# Patient Record
Sex: Male | Born: 1950 | Race: White | Hispanic: No | Marital: Married | Smoking: Never smoker
Health system: Southern US, Community
[De-identification: ages and names within clinical notes are randomized; demographics above are authoritative.]

## PROBLEM LIST (undated history)

## (undated) DIAGNOSIS — K4090 Unilateral inguinal hernia, without obstruction or gangrene, not specified as recurrent: Secondary | ICD-10-CM

## (undated) DIAGNOSIS — E785 Hyperlipidemia, unspecified: Secondary | ICD-10-CM

## (undated) DIAGNOSIS — R972 Elevated prostate specific antigen [PSA]: Secondary | ICD-10-CM

## (undated) DIAGNOSIS — I10 Essential (primary) hypertension: Secondary | ICD-10-CM

## (undated) DIAGNOSIS — Z789 Other specified health status: Secondary | ICD-10-CM

## (undated) HISTORY — DX: Essential (primary) hypertension: I10

## (undated) HISTORY — DX: Unilateral inguinal hernia, without obstruction or gangrene, not specified as recurrent: K40.90

## (undated) HISTORY — DX: Hyperlipidemia, unspecified: E78.5

## (undated) HISTORY — PX: TONSILLECTOMY: SUR1361

## (undated) HISTORY — DX: Elevated prostate specific antigen (PSA): R97.20

---

## 2001-02-27 HISTORY — PX: HAND RECONSTRUCTION: SHX1730

## 2003-06-19 ENCOUNTER — Ambulatory Visit (HOSPITAL_COMMUNITY): Admission: RE | Admit: 2003-06-19 | Discharge: 2003-06-20 | Payer: Self-pay | Admitting: Orthopedic Surgery

## 2012-01-17 ENCOUNTER — Encounter: Payer: Self-pay | Admitting: Internal Medicine

## 2012-02-07 ENCOUNTER — Ambulatory Visit (AMBULATORY_SURGERY_CENTER): Payer: BC Managed Care – PPO | Admitting: *Deleted

## 2012-02-07 ENCOUNTER — Encounter: Payer: Self-pay | Admitting: Internal Medicine

## 2012-02-07 VITALS — Ht 68.0 in | Wt 299.0 lb

## 2012-02-07 DIAGNOSIS — Z1211 Encounter for screening for malignant neoplasm of colon: Secondary | ICD-10-CM

## 2012-02-07 MED ORDER — MOVIPREP 100 G PO SOLR: ORAL | Status: DC

## 2012-02-20 ENCOUNTER — Encounter: Payer: Self-pay | Admitting: Internal Medicine

## 2015-10-12 DIAGNOSIS — M25512 Pain in left shoulder: Secondary | ICD-10-CM | POA: Diagnosis not present

## 2015-10-21 DIAGNOSIS — H52223 Regular astigmatism, bilateral: Secondary | ICD-10-CM | POA: Diagnosis not present

## 2015-10-21 DIAGNOSIS — H524 Presbyopia: Secondary | ICD-10-CM | POA: Diagnosis not present

## 2015-10-21 DIAGNOSIS — H5203 Hypermetropia, bilateral: Secondary | ICD-10-CM | POA: Diagnosis not present

## 2015-10-29 DIAGNOSIS — H33322 Round hole, left eye: Secondary | ICD-10-CM | POA: Diagnosis not present

## 2015-10-29 DIAGNOSIS — H43813 Vitreous degeneration, bilateral: Secondary | ICD-10-CM | POA: Diagnosis not present

## 2015-10-29 DIAGNOSIS — H35412 Lattice degeneration of retina, left eye: Secondary | ICD-10-CM | POA: Diagnosis not present

## 2015-10-29 DIAGNOSIS — H2513 Age-related nuclear cataract, bilateral: Secondary | ICD-10-CM | POA: Diagnosis not present

## 2015-11-28 HISTORY — PX: EYE SURGERY: SHX253

## 2015-12-02 DIAGNOSIS — H25813 Combined forms of age-related cataract, bilateral: Secondary | ICD-10-CM | POA: Diagnosis not present

## 2015-12-02 DIAGNOSIS — H524 Presbyopia: Secondary | ICD-10-CM | POA: Diagnosis not present

## 2015-12-02 DIAGNOSIS — H35412 Lattice degeneration of retina, left eye: Secondary | ICD-10-CM | POA: Diagnosis not present

## 2015-12-27 DIAGNOSIS — H2512 Age-related nuclear cataract, left eye: Secondary | ICD-10-CM | POA: Diagnosis not present

## 2016-01-03 DIAGNOSIS — H2512 Age-related nuclear cataract, left eye: Secondary | ICD-10-CM | POA: Diagnosis not present

## 2016-01-03 DIAGNOSIS — H25812 Combined forms of age-related cataract, left eye: Secondary | ICD-10-CM | POA: Diagnosis not present

## 2016-02-17 DIAGNOSIS — H35412 Lattice degeneration of retina, left eye: Secondary | ICD-10-CM | POA: Diagnosis not present

## 2016-02-17 DIAGNOSIS — H43813 Vitreous degeneration, bilateral: Secondary | ICD-10-CM | POA: Diagnosis not present

## 2016-03-02 DIAGNOSIS — H43813 Vitreous degeneration, bilateral: Secondary | ICD-10-CM | POA: Diagnosis not present

## 2016-03-02 DIAGNOSIS — H35412 Lattice degeneration of retina, left eye: Secondary | ICD-10-CM | POA: Diagnosis not present

## 2016-03-03 ENCOUNTER — Ambulatory Visit
Admission: EM | Admit: 2016-03-03 | Discharge: 2016-03-03 | Disposition: A | Discharge status: Home / Self Care | Payer: PPO | Attending: Family Medicine | Admitting: Family Medicine

## 2016-03-03 DIAGNOSIS — J101 Influenza due to other identified influenza virus with other respiratory manifestations: Secondary | ICD-10-CM

## 2016-03-03 LAB — RAPID INFLUENZA A&B ANTIGENS (ARMC ONLY)
INFLUENZA A (ARMC): POSITIVE — AB
INFLUENZA B (ARMC): NEGATIVE

## 2016-03-03 MED ORDER — OSELTAMIVIR PHOSPHATE 75 MG PO CAPS: 75.0000 mg | ORAL_CAPSULE | Freq: Two times a day (BID) | ORAL | 0 refills | Status: DC

## 2016-03-03 NOTE — ED Triage Notes (Signed)
Patient complains of fever, bodyaches, congestion. Patient states that he has been chills, sweats.  Patient states that symptoms started late Wednesday evening and have been worsening.

## 2016-03-03 NOTE — ED Provider Notes (Signed)
[facility]    CSN: [id number] Arrival date & time: 03/03/16  [id number]     History   Chief Complaint Chief Complaint  Patient presents with  . Cough    HPI Eric HerterDavid P Oliver is a 66 y.o. male.   The history is provided by the patient.  URI  Presenting symptoms: congestion, cough and fever   Severity:  Moderate Onset quality:  Sudden Duration:  2 days Timing:  Constant Progression:  Worsening Chronicity:  New Relieved by:  Nothing Worsened by:  Nothing Associated symptoms: headaches and myalgias   Associated symptoms: no wheezing   Risk factors: being elderly and sick contacts   Risk factors: no chronic cardiac disease, no chronic kidney disease, no chronic respiratory disease, no diabetes mellitus, no immunosuppression, no recent illness and no recent travel     History reviewed. No pertinent past medical history.  There are no active problems to display for this patient.   Past Surgical History:  Procedure Laterality Date  . HAND RECONSTRUCTION  2003   right       Home Medications    Prior to Admission medications   Medication Sig Start Date End Date Taking? Authorizing Provider  aspirin 81 MG tablet Take 81 mg by mouth daily.    Historical Provider, MD  MOVIPREP 100 G SOLR moviprep as directed. No substitutions 02/07/12   Hilarie FredricksonJohn N Perry, MD  oseltamivir (TAMIFLU) 75 MG capsule Take 1 capsule (75 mg total) by mouth 2 (two) times daily. 03/03/16   Payton Mccallumrlando Augustin Bun, MD    Family History Family History  Problem Relation Age of Onset  . Colon cancer Neg Hx   . Stomach cancer Neg Hx     Social History Social History  Substance Use Topics  . Smoking status: Never Smoker  . Smokeless tobacco: Never Used  . Alcohol use 3.6 oz/week    6 Cans of beer per week     Allergies   Patient has no known allergies.   Review of Systems Review of Systems  Constitutional: Positive for fever.  HENT: Positive for congestion.   Respiratory: Positive for  cough. Negative for wheezing.   Musculoskeletal: Positive for myalgias.  Neurological: Positive for headaches.     Physical Exam Triage Vital Signs ED Triage Vitals  Enc Vitals Group     BP 03/03/16 1018 136/79     Pulse Rate 03/03/16 1018 96     Resp 03/03/16 1018 17     Temp 03/03/16 1018 99.5 F (37.5 C)     Temp Source 03/03/16 1018 Oral     SpO2 03/03/16 1018 98 %     Weight 03/03/16 1015 185 lb (83.9 kg)     Height 03/03/16 1015 5\' 8"  (1.727 m)     Head Circumference --      Peak Flow --      Pain Score 03/03/16 1017 7     Pain Loc --      Pain Edu? --      Excl. in GC? --    No data found.   Updated Vital Signs BP 136/79 (BP Location: Left Arm)   Pulse 96   Temp 99.5 F (37.5 C) (Oral)   Resp 17   Ht 5\' 8"  (1.727 m)   Wt 185 lb (83.9 kg)   SpO2 98%   BMI 28.13 kg/m   Visual Acuity Right Eye Distance:   Left Eye Distance:   Bilateral Distance:    Right Eye Near:  Left Eye Near:    Bilateral Near:     Physical Exam  Constitutional: He appears well-developed and well-nourished. No distress.  HENT:  Head: Normocephalic and atraumatic.  Right Ear: Tympanic membrane, external ear and ear canal normal.  Left Ear: Tympanic membrane, external ear and ear canal normal.  Nose: Nose normal.  Mouth/Throat: Uvula is midline, oropharynx is clear and moist and mucous membranes are normal. No oropharyngeal exudate or tonsillar abscesses.  Eyes: Conjunctivae and EOM are normal. Pupils are equal, round, and reactive to light. Right eye exhibits no discharge. Left eye exhibits no discharge. No scleral icterus.  Neck: Normal range of motion. Neck supple. No tracheal deviation present. No thyromegaly present.  Cardiovascular: Normal rate, regular rhythm and normal heart sounds.   Pulmonary/Chest: Effort normal and breath sounds normal. No stridor. No respiratory distress. He has no wheezes. He has no rales. He exhibits no tenderness.  Lymphadenopathy:    He has no  cervical adenopathy.  Neurological: He is alert.  Skin: Skin is warm and dry. No rash noted. He is not diaphoretic.  Nursing note and vitals reviewed.    UC Treatments / Results  Labs (all labs ordered are listed, but only abnormal results are displayed) Labs Reviewed  RAPID INFLUENZA A&B ANTIGENS ([facility] ONLY) - Abnormal; Notable for the following:       Result Value   Influenza A ([facility]) POSITIVE (*)    All other components within normal limits    EKG  EKG Interpretation None       Radiology No results found.  Procedures Procedures (including critical care time)  Medications Ordered in UC Medications - No data to display   Initial Impression / Assessment and Plan / UC Course  I have reviewed the triage vital signs and the nursing notes.  Pertinent labs & imaging results that were available during my care of the patient were reviewed by me and considered in my medical decision making (see chart for details).  Clinical Course       Final Clinical Impressions(s) / UC Diagnoses   Final diagnoses:  Influenza A    New Prescriptions Discharge Medication List as of 03/03/2016 10:54 AM    START taking these medications   Details  oseltamivir (TAMIFLU) 75 MG capsule Take 1 capsule (75 mg total) by mouth 2 (two) times daily., Starting Fri 03/03/2016, Normal       1. Lab results and diagnosis reviewed with patient 2. rx as per orders above; reviewed possible side effects, interactions, risks and benefits  3. Recommend supportive treatment with rest, fluids   4. Follow-up prn if symptoms worsen or don't improve   Payton Mccallum, MD 03/03/16 [phone]

## 2016-03-09 DIAGNOSIS — H35412 Lattice degeneration of retina, left eye: Secondary | ICD-10-CM | POA: Diagnosis not present

## 2016-03-09 DIAGNOSIS — H04123 Dry eye syndrome of bilateral lacrimal glands: Secondary | ICD-10-CM | POA: Diagnosis not present

## 2016-03-09 DIAGNOSIS — H26492 Other secondary cataract, left eye: Secondary | ICD-10-CM | POA: Diagnosis not present

## 2016-03-09 DIAGNOSIS — H2511 Age-related nuclear cataract, right eye: Secondary | ICD-10-CM | POA: Diagnosis not present

## 2016-03-09 DIAGNOSIS — H33312 Horseshoe tear of retina without detachment, left eye: Secondary | ICD-10-CM | POA: Diagnosis not present

## 2016-07-10 DIAGNOSIS — M5431 Sciatica, right side: Secondary | ICD-10-CM | POA: Diagnosis not present

## 2016-07-10 DIAGNOSIS — S335XXA Sprain of ligaments of lumbar spine, initial encounter: Secondary | ICD-10-CM | POA: Diagnosis not present

## 2016-08-01 DIAGNOSIS — H5203 Hypermetropia, bilateral: Secondary | ICD-10-CM | POA: Diagnosis not present

## 2016-08-01 DIAGNOSIS — H04123 Dry eye syndrome of bilateral lacrimal glands: Secondary | ICD-10-CM | POA: Diagnosis not present

## 2016-08-01 DIAGNOSIS — H5310 Unspecified subjective visual disturbances: Secondary | ICD-10-CM | POA: Diagnosis not present

## 2016-08-01 DIAGNOSIS — H26492 Other secondary cataract, left eye: Secondary | ICD-10-CM | POA: Diagnosis not present

## 2016-08-29 DIAGNOSIS — H5310 Unspecified subjective visual disturbances: Secondary | ICD-10-CM | POA: Diagnosis not present

## 2016-08-29 DIAGNOSIS — H04123 Dry eye syndrome of bilateral lacrimal glands: Secondary | ICD-10-CM | POA: Diagnosis not present

## 2016-09-20 DIAGNOSIS — M545 Low back pain: Secondary | ICD-10-CM | POA: Diagnosis not present

## 2016-09-21 ENCOUNTER — Other Ambulatory Visit: Payer: Self-pay | Admitting: Sports Medicine

## 2016-09-21 DIAGNOSIS — G8929 Other chronic pain: Secondary | ICD-10-CM

## 2016-09-21 DIAGNOSIS — M545 Low back pain: Principal | ICD-10-CM

## 2016-10-01 ENCOUNTER — Ambulatory Visit
Admission: RE | Admit: 2016-10-01 | Discharge: 2016-10-01 | Disposition: A | Discharge status: Home / Self Care | Payer: PPO | Source: Ambulatory Visit | Attending: Sports Medicine | Admitting: Sports Medicine

## 2016-10-01 DIAGNOSIS — M5126 Other intervertebral disc displacement, lumbar region: Secondary | ICD-10-CM | POA: Diagnosis not present

## 2016-10-01 DIAGNOSIS — M545 Low back pain: Principal | ICD-10-CM

## 2016-10-01 DIAGNOSIS — G8929 Other chronic pain: Secondary | ICD-10-CM

## 2016-10-03 DIAGNOSIS — M5106 Intervertebral disc disorders with myelopathy, lumbar region: Secondary | ICD-10-CM | POA: Diagnosis not present

## 2016-10-05 ENCOUNTER — Other Ambulatory Visit: Payer: Self-pay | Admitting: Neurological Surgery

## 2016-10-05 DIAGNOSIS — Z6827 Body mass index (BMI) 27.0-27.9, adult: Secondary | ICD-10-CM | POA: Diagnosis not present

## 2016-10-05 DIAGNOSIS — R030 Elevated blood-pressure reading, without diagnosis of hypertension: Secondary | ICD-10-CM | POA: Diagnosis not present

## 2016-10-05 DIAGNOSIS — M5126 Other intervertebral disc displacement, lumbar region: Secondary | ICD-10-CM | POA: Diagnosis not present

## 2016-10-10 DIAGNOSIS — M5417 Radiculopathy, lumbosacral region: Secondary | ICD-10-CM | POA: Diagnosis not present

## 2016-10-20 ENCOUNTER — Encounter (HOSPITAL_COMMUNITY)
Admission: RE | Admit: 2016-10-20 | Discharge: 2016-10-20 | Disposition: A | Discharge status: Home / Self Care | Payer: PPO | Source: Ambulatory Visit | Attending: Neurological Surgery | Admitting: Neurological Surgery

## 2016-10-20 ENCOUNTER — Encounter (HOSPITAL_COMMUNITY)
Admission: RE | Admit: 2016-10-20 | Discharge: 2016-10-20 | Disposition: A | Discharge status: Home / Self Care | Payer: PPO | Source: Ambulatory Visit | Attending: Neurology | Admitting: Neurology

## 2016-10-20 ENCOUNTER — Encounter (HOSPITAL_COMMUNITY): Payer: Self-pay

## 2016-10-20 DIAGNOSIS — Z01818 Encounter for other preprocedural examination: Secondary | ICD-10-CM | POA: Insufficient documentation

## 2016-10-20 DIAGNOSIS — M5126 Other intervertebral disc displacement, lumbar region: Secondary | ICD-10-CM | POA: Diagnosis not present

## 2016-10-20 DIAGNOSIS — R918 Other nonspecific abnormal finding of lung field: Secondary | ICD-10-CM | POA: Diagnosis not present

## 2016-10-20 HISTORY — DX: Other specified health status: Z78.9

## 2016-10-20 LAB — BASIC METABOLIC PANEL
ANION GAP: 10 (ref 5–15)
BUN: 12 mg/dL (ref 6–20)
CALCIUM: 9.4 mg/dL (ref 8.9–10.3)
CO2: 25 mmol/L (ref 22–32)
CREATININE: 1.01 mg/dL (ref 0.61–1.24)
Chloride: 106 mmol/L (ref 101–111)
Glucose, Bld: 140 mg/dL — ABNORMAL HIGH (ref 65–99)
Potassium: 3.6 mmol/L (ref 3.5–5.1)
Sodium: 141 mmol/L (ref 135–145)

## 2016-10-20 LAB — CBC WITH DIFFERENTIAL/PLATELET
BASOS ABS: 0 10*3/uL (ref 0.0–0.1)
BASOS PCT: 0 %
EOS ABS: 0.1 10*3/uL (ref 0.0–0.7)
Eosinophils Relative: 2 %
HEMATOCRIT: 44.1 % (ref 39.0–52.0)
HEMOGLOBIN: 15.2 g/dL (ref 13.0–17.0)
Lymphocytes Relative: 25 %
Lymphs Abs: 1.3 10*3/uL (ref 0.7–4.0)
MCH: 30 pg (ref 26.0–34.0)
MCHC: 34.5 g/dL (ref 30.0–36.0)
MCV: 87.2 fL (ref 78.0–100.0)
MONO ABS: 0.4 10*3/uL (ref 0.1–1.0)
MONOS PCT: 9 %
NEUTROS ABS: 3.4 10*3/uL (ref 1.7–7.7)
NEUTROS PCT: 64 %
Platelets: 273 10*3/uL (ref 150–400)
RBC: 5.06 MIL/uL (ref 4.22–5.81)
RDW: 12.5 % (ref 11.5–15.5)
WBC: 5.2 10*3/uL (ref 4.0–10.5)

## 2016-10-20 LAB — SURGICAL PCR SCREEN
MRSA, PCR: NEGATIVE
STAPHYLOCOCCUS AUREUS: NEGATIVE

## 2016-10-20 LAB — PROTIME-INR
INR: 0.99
PROTHROMBIN TIME: 13.1 s (ref 11.4–15.2)

## 2016-10-20 NOTE — Pre-Procedure Instructions (Addendum)
[place] [facility]  10/20/2016      [id number] - [place], [place] - [address] Phone: [phone] Fax: [phone]    Your procedure is scheduled on 10/26/16  Report to [facility] Admitting  at 1:00 P.M.  Call this number if you have problems the morning of surgery:  [phone]   Remember:  Do not eat food or drink liquids after midnight.  Take these medicines the morning of surgery with A SIP OF WATER    Gabapentin if needed,hydrocodone if needed  STOP all herbel meds, nsaids (aleve,naproxen,advil,ibuprofen) prior to surgery starting today 10/20/16 including all vitamins/supplements,aspirin,fish oil, goodys ,.BC powders   Do not wear jewelry  Do not wear lotions, powders, or perfumes, or deoderant.  Do not shave 48 hours prior to surgery.  Men may shave face and neck.  Do not bring valuables to the hospital.  [facility] is not responsible for any belongings or valuables.  Contacts, dentures or bridgework may not be worn into surgery.  Leave your suitcase in the car.  After surgery it may be brought to your room.  For patients admitted to the hospital, discharge time will be determined by your treatment team.  Patients discharged the day of surgery will not be allowed to drive home.   Special instructions:   Special Instructions: [place] - Preparing for Surgery  Before surgery, you can play an important role.  Because skin is not sterile, your skin needs to be as free of germs as possible.  You can reduce the number of germs on you skin by washing with CHG (chlorahexidine gluconate) soap before surgery.  CHG is an antiseptic cleaner which kills germs and bonds with the skin to continue killing germs even after washing.  Please DO NOT use if you have an allergy to CHG or antibacterial soaps.  If your skin becomes reddened/irritated stop using the CHG and inform your nurse when you arrive at [facility].  Do not shave (including legs and underarms) for at least 48 hours prior to the first CHG shower.  You may shave your face.  Please follow these instructions carefully:   1.  Shower with CHG Soap the night before surgery and the morning of Surgery.  2.  If you choose to wash your hair, wash your hair first as usual with your normal shampoo.  3.  After you shampoo, rinse your hair and body thoroughly to remove the Shampoo.  4.  Use CHG as you would any other liquid soap.  You can apply chg directly  to the skin and wash gently with scrungie or a clean washcloth.  5.  Apply the CHG Soap to your body ONLY FROM THE NECK DOWN.  Do not use on open wounds or open sores.  Avoid contact with your eyes ears, mouth and genitals (private parts).  Wash genitals (private parts)       with your normal soap.  6.  Wash thoroughly, paying special attention to the area where your surgery will be performed.  7.  Thoroughly rinse your body with warm water from the neck down.  8.  DO NOT shower/wash with your normal soap after using and rinsing off the CHG Soap.  9.  Pat yourself dry with a clean towel.            10.  Wear clean pajamas.            11.  Place clean sheets on your bed the night of your first shower and do not sleep with pets.  Day of Surgery  Do not apply any lotions/deodorants the morning of surgery.  Please wear clean clothes to the hospital/surgery center.  Please read over the fact sheets that you were given.

## 2016-10-26 ENCOUNTER — Ambulatory Visit (HOSPITAL_COMMUNITY)
Admission: RE | Disposition: A | Discharge status: Home / Self Care | Payer: Self-pay | Source: Ambulatory Visit | Attending: Neurological Surgery

## 2016-10-26 ENCOUNTER — Ambulatory Visit (HOSPITAL_COMMUNITY): Payer: PPO | Admitting: Certified Registered Nurse Anesthetist

## 2016-10-26 ENCOUNTER — Encounter (HOSPITAL_COMMUNITY): Payer: Self-pay | Admitting: *Deleted

## 2016-10-26 ENCOUNTER — Observation Stay (HOSPITAL_COMMUNITY)
Admission: RE | Admit: 2016-10-26 | Discharge: 2016-10-26 | Disposition: A | Discharge status: Home / Self Care | Payer: PPO | Source: Ambulatory Visit | Attending: Neurological Surgery | Admitting: Neurological Surgery

## 2016-10-26 ENCOUNTER — Ambulatory Visit (HOSPITAL_COMMUNITY): Payer: PPO

## 2016-10-26 DIAGNOSIS — Z4789 Encounter for other orthopedic aftercare: Secondary | ICD-10-CM | POA: Diagnosis not present

## 2016-10-26 DIAGNOSIS — Z9102 Food additives allergy status: Secondary | ICD-10-CM | POA: Insufficient documentation

## 2016-10-26 DIAGNOSIS — Z419 Encounter for procedure for purposes other than remedying health state, unspecified: Secondary | ICD-10-CM

## 2016-10-26 DIAGNOSIS — M5126 Other intervertebral disc displacement, lumbar region: Secondary | ICD-10-CM | POA: Diagnosis not present

## 2016-10-26 DIAGNOSIS — G8929 Other chronic pain: Secondary | ICD-10-CM | POA: Insufficient documentation

## 2016-10-26 DIAGNOSIS — M48061 Spinal stenosis, lumbar region without neurogenic claudication: Secondary | ICD-10-CM | POA: Insufficient documentation

## 2016-10-26 DIAGNOSIS — Z79899 Other long term (current) drug therapy: Secondary | ICD-10-CM | POA: Diagnosis not present

## 2016-10-26 DIAGNOSIS — M5116 Intervertebral disc disorders with radiculopathy, lumbar region: Secondary | ICD-10-CM | POA: Diagnosis not present

## 2016-10-26 DIAGNOSIS — Z9889 Other specified postprocedural states: Secondary | ICD-10-CM

## 2016-10-26 DIAGNOSIS — Z7982 Long term (current) use of aspirin: Secondary | ICD-10-CM | POA: Diagnosis not present

## 2016-10-26 HISTORY — PX: LUMBAR LAMINECTOMY/DECOMPRESSION MICRODISCECTOMY: SHX5026

## 2016-10-26 SURGERY — LUMBAR LAMINECTOMY/DECOMPRESSION MICRODISCECTOMY 2 LEVELS
Anesthesia: General | Site: Back | Laterality: Right

## 2016-10-26 MED ORDER — HYDROCODONE-ACETAMINOPHEN 7.5-325 MG PO TABS
ORAL_TABLET | ORAL | Status: AC
  Filled 2016-10-26: qty 1

## 2016-10-26 MED ORDER — ONDANSETRON HCL 4 MG/2ML IJ SOLN
INTRAMUSCULAR | Status: AC
  Filled 2016-10-26: qty 2

## 2016-10-26 MED ORDER — 0.9 % SODIUM CHLORIDE (POUR BTL) OPTIME
TOPICAL | Status: DC | PRN
  Administered 2016-10-26: 1000 mL

## 2016-10-26 MED ORDER — METHOCARBAMOL 500 MG PO TABS
500.0000 mg | ORAL_TABLET | Freq: Four times a day (QID) | ORAL | Status: DC | PRN
  Administered 2016-10-26: 500 mg via ORAL

## 2016-10-26 MED ORDER — POTASSIUM CHLORIDE IN NACL 20-0.9 MEQ/L-% IV SOLN: INTRAVENOUS | Status: DC

## 2016-10-26 MED ORDER — PROPOFOL 10 MG/ML IV BOLUS
INTRAVENOUS | Status: AC
  Filled 2016-10-26: qty 20

## 2016-10-26 MED ORDER — LACTATED RINGERS IV SOLN
INTRAVENOUS | Status: DC
  Administered 2016-10-26 (×2): via INTRAVENOUS

## 2016-10-26 MED ORDER — MIDAZOLAM HCL 5 MG/5ML IJ SOLN
INTRAMUSCULAR | Status: DC | PRN
  Administered 2016-10-26: 2 mg via INTRAVENOUS

## 2016-10-26 MED ORDER — BUPIVACAINE HCL (PF) 0.5 % IJ SOLN
INTRAMUSCULAR | Status: DC | PRN
  Administered 2016-10-26: 4 mL

## 2016-10-26 MED ORDER — ROCURONIUM BROMIDE 100 MG/10ML IV SOLN
INTRAVENOUS | Status: DC | PRN
  Administered 2016-10-26: 10 mg via INTRAVENOUS
  Administered 2016-10-26: 50 mg via INTRAVENOUS

## 2016-10-26 MED ORDER — HEMOSTATIC AGENTS (NO CHARGE) OPTIME
TOPICAL | Status: DC | PRN
  Administered 2016-10-26: 1 via TOPICAL

## 2016-10-26 MED ORDER — METHOCARBAMOL 500 MG PO TABS
ORAL_TABLET | ORAL | Status: AC
  Filled 2016-10-26: qty 1

## 2016-10-26 MED ORDER — HYDROMORPHONE HCL 1 MG/ML IJ SOLN
INTRAMUSCULAR | Status: AC
  Filled 2016-10-26: qty 1

## 2016-10-26 MED ORDER — CHLORHEXIDINE GLUCONATE CLOTH 2 % EX PADS: 6.0000 | MEDICATED_PAD | Freq: Once | CUTANEOUS | Status: DC

## 2016-10-26 MED ORDER — EPHEDRINE SULFATE 50 MG/ML IJ SOLN
INTRAMUSCULAR | Status: DC | PRN
  Administered 2016-10-26 (×3): 5 mg via INTRAVENOUS

## 2016-10-26 MED ORDER — PHENYLEPHRINE 40 MCG/ML (10ML) SYRINGE FOR IV PUSH (FOR BLOOD PRESSURE SUPPORT)
PREFILLED_SYRINGE | INTRAVENOUS | Status: AC
  Filled 2016-10-26: qty 10

## 2016-10-26 MED ORDER — SODIUM CHLORIDE 0.9 % IR SOLN
Status: DC | PRN
  Administered 2016-10-26: 15:00:00

## 2016-10-26 MED ORDER — DEXAMETHASONE SODIUM PHOSPHATE 10 MG/ML IJ SOLN
10.0000 mg | INTRAMUSCULAR | Status: DC
  Filled 2016-10-26: qty 1

## 2016-10-26 MED ORDER — EPHEDRINE 5 MG/ML INJ
INTRAVENOUS | Status: AC
  Filled 2016-10-26: qty 10

## 2016-10-26 MED ORDER — BUPIVACAINE HCL (PF) 0.5 % IJ SOLN
INTRAMUSCULAR | Status: AC
  Filled 2016-10-26: qty 30

## 2016-10-26 MED ORDER — DEXAMETHASONE SODIUM PHOSPHATE 10 MG/ML IJ SOLN
INTRAMUSCULAR | Status: DC | PRN
  Administered 2016-10-26: 10 mg via INTRAVENOUS

## 2016-10-26 MED ORDER — MIDAZOLAM HCL 2 MG/2ML IJ SOLN
INTRAMUSCULAR | Status: AC
  Filled 2016-10-26: qty 2

## 2016-10-26 MED ORDER — THROMBIN 5000 UNITS EX SOLR
CUTANEOUS | Status: AC
  Filled 2016-10-26: qty 15000

## 2016-10-26 MED ORDER — PROMETHAZINE HCL 25 MG/ML IJ SOLN: 6.2500 mg | INTRAMUSCULAR | Status: DC | PRN

## 2016-10-26 MED ORDER — SUGAMMADEX SODIUM 200 MG/2ML IV SOLN
INTRAVENOUS | Status: DC | PRN
  Administered 2016-10-26: 200 mg via INTRAVENOUS

## 2016-10-26 MED ORDER — ACETAMINOPHEN 10 MG/ML IV SOLN
INTRAVENOUS | Status: AC
  Filled 2016-10-26: qty 100

## 2016-10-26 MED ORDER — FENTANYL CITRATE (PF) 100 MCG/2ML IJ SOLN
INTRAMUSCULAR | Status: DC | PRN
  Administered 2016-10-26 (×5): 50 ug via INTRAVENOUS

## 2016-10-26 MED ORDER — THROMBIN 5000 UNITS EX SOLR
CUTANEOUS | Status: DC | PRN
  Administered 2016-10-26: 10000 [IU] via TOPICAL

## 2016-10-26 MED ORDER — PHENYLEPHRINE HCL 10 MG/ML IJ SOLN
INTRAMUSCULAR | Status: DC | PRN
  Administered 2016-10-26: 40 ug via INTRAVENOUS

## 2016-10-26 MED ORDER — ACETAMINOPHEN 10 MG/ML IV SOLN
INTRAVENOUS | Status: DC | PRN
  Administered 2016-10-26: 1000 mg via INTRAVENOUS

## 2016-10-26 MED ORDER — HYDROCODONE-ACETAMINOPHEN 7.5-325 MG PO TABS
1.0000 | ORAL_TABLET | Freq: Four times a day (QID) | ORAL | Status: DC
  Administered 2016-10-26: 1 via ORAL

## 2016-10-26 MED ORDER — FENTANYL CITRATE (PF) 250 MCG/5ML IJ SOLN
INTRAMUSCULAR | Status: AC
  Filled 2016-10-26: qty 5

## 2016-10-26 MED ORDER — DEXAMETHASONE SODIUM PHOSPHATE 10 MG/ML IJ SOLN
INTRAMUSCULAR | Status: AC
  Filled 2016-10-26: qty 1

## 2016-10-26 MED ORDER — HYDROMORPHONE HCL 1 MG/ML IJ SOLN
0.2500 mg | INTRAMUSCULAR | Status: DC | PRN
  Administered 2016-10-26 (×4): 0.5 mg via INTRAVENOUS

## 2016-10-26 MED ORDER — LIDOCAINE HCL (CARDIAC) 20 MG/ML IV SOLN
INTRAVENOUS | Status: DC | PRN
  Administered 2016-10-26: 60 mg via INTRAVENOUS

## 2016-10-26 MED ORDER — ONDANSETRON HCL 4 MG/2ML IJ SOLN
INTRAMUSCULAR | Status: DC | PRN
  Administered 2016-10-26: 4 mg via INTRAVENOUS

## 2016-10-26 MED ORDER — PHENYLEPHRINE HCL 10 MG/ML IJ SOLN
INTRAVENOUS | Status: DC | PRN
  Administered 2016-10-26: 20 ug/min via INTRAVENOUS

## 2016-10-26 MED ORDER — PROPOFOL 10 MG/ML IV BOLUS
INTRAVENOUS | Status: DC | PRN
  Administered 2016-10-26: 150 mg via INTRAVENOUS

## 2016-10-26 MED ORDER — CEFAZOLIN SODIUM-DEXTROSE 2-4 GM/100ML-% IV SOLN
2.0000 g | INTRAVENOUS | Status: AC
  Administered 2016-10-26: 2 g via INTRAVENOUS
  Filled 2016-10-26: qty 100

## 2016-10-26 MED ORDER — THROMBIN 5000 UNITS EX SOLR
OROMUCOSAL | Status: DC | PRN
  Administered 2016-10-26: 15:00:00 via TOPICAL

## 2016-10-26 MED ORDER — DEXTROSE 5 % IV SOLN: 500.0000 mg | Freq: Four times a day (QID) | INTRAVENOUS | Status: DC | PRN

## 2016-10-26 SURGICAL SUPPLY — 45 items
BAG DECANTER FOR FLEXI CONT (MISCELLANEOUS) ×3 IMPLANT
BENZOIN TINCTURE PRP APPL 2/3 (GAUZE/BANDAGES/DRESSINGS) ×3 IMPLANT
BUR MATCHSTICK NEURO 3.0 LAGG (BURR) ×3 IMPLANT
CANISTER SUCT 3000ML PPV (MISCELLANEOUS) ×3 IMPLANT
CARTRIDGE OIL MAESTRO DRILL (MISCELLANEOUS) ×1 IMPLANT
CLOSURE WOUND 1/2 X4 (GAUZE/BANDAGES/DRESSINGS) ×1
DECANTER SPIKE VIAL GLASS SM (MISCELLANEOUS) ×3 IMPLANT
DIFFUSER DRILL AIR PNEUMATIC (MISCELLANEOUS) ×3 IMPLANT
DRAPE LAPAROTOMY 100X72X124 (DRAPES) ×3 IMPLANT
DRAPE MICROSCOPE LEICA (MISCELLANEOUS) ×3 IMPLANT
DRAPE POUCH INSTRU U-SHP 10X18 (DRAPES) ×3 IMPLANT
DRAPE SURG 17X23 STRL (DRAPES) ×3 IMPLANT
DRSG OPSITE POSTOP 3X4 (GAUZE/BANDAGES/DRESSINGS) ×3 IMPLANT
DURAPREP 26ML APPLICATOR (WOUND CARE) ×3 IMPLANT
ELECT REM PT RETURN 9FT ADLT (ELECTROSURGICAL) ×3
ELECTRODE REM PT RTRN 9FT ADLT (ELECTROSURGICAL) ×1 IMPLANT
GAUZE SPONGE 4X4 16PLY XRAY LF (GAUZE/BANDAGES/DRESSINGS) IMPLANT
GLOVE BIO SURGEON STRL SZ7 (GLOVE) IMPLANT
GLOVE BIO SURGEON STRL SZ8 (GLOVE) ×3 IMPLANT
GLOVE BIOGEL PI IND STRL 7.0 (GLOVE) IMPLANT
GLOVE BIOGEL PI INDICATOR 7.0 (GLOVE)
GOWN STRL REUS W/ TWL LRG LVL3 (GOWN DISPOSABLE) IMPLANT
GOWN STRL REUS W/ TWL XL LVL3 (GOWN DISPOSABLE) ×1 IMPLANT
GOWN STRL REUS W/TWL 2XL LVL3 (GOWN DISPOSABLE) IMPLANT
GOWN STRL REUS W/TWL LRG LVL3 (GOWN DISPOSABLE)
GOWN STRL REUS W/TWL XL LVL3 (GOWN DISPOSABLE) ×2
HEMOSTAT POWDER KIT SURGIFOAM (HEMOSTASIS) ×3 IMPLANT
KIT BASIN OR (CUSTOM PROCEDURE TRAY) ×3 IMPLANT
KIT ROOM TURNOVER OR (KITS) ×3 IMPLANT
NEEDLE HYPO 25X1 1.5 SAFETY (NEEDLE) ×3 IMPLANT
NEEDLE SPNL 20GX3.5 QUINCKE YW (NEEDLE) IMPLANT
NS IRRIG 1000ML POUR BTL (IV SOLUTION) ×3 IMPLANT
OIL CARTRIDGE MAESTRO DRILL (MISCELLANEOUS) ×3
PACK LAMINECTOMY NEURO (CUSTOM PROCEDURE TRAY) ×3 IMPLANT
PAD ARMBOARD 7.5X6 YLW CONV (MISCELLANEOUS) ×9 IMPLANT
RUBBERBAND STERILE (MISCELLANEOUS) ×6 IMPLANT
SPONGE SURGIFOAM ABS GEL SZ50 (HEMOSTASIS) ×3 IMPLANT
STRIP CLOSURE SKIN 1/2X4 (GAUZE/BANDAGES/DRESSINGS) ×2 IMPLANT
SUT VIC AB 0 CT1 18XCR BRD8 (SUTURE) ×1 IMPLANT
SUT VIC AB 0 CT1 8-18 (SUTURE) ×2
SUT VIC AB 2-0 CP2 18 (SUTURE) ×3 IMPLANT
SUT VIC AB 3-0 SH 8-18 (SUTURE) ×3 IMPLANT
TOWEL GREEN STERILE (TOWEL DISPOSABLE) ×3 IMPLANT
TOWEL GREEN STERILE FF (TOWEL DISPOSABLE) ×3 IMPLANT
WATER STERILE IRR 1000ML POUR (IV SOLUTION) ×3 IMPLANT

## 2016-10-26 NOTE — H&P (Signed)
Subjective: Patient is a 66 y.o. male admitted for back and R leg pain. Onset of symptoms was several months ago, gradually worsening since that time.  The pain is rated severe, and is located at the across the lower back and radiates to RLE. The pain is described as aching and occurs all day. The symptoms have been progressive. Symptoms are exacerbated by exercise. MRI or CT showed stenosis/ HNP L3-4, L4-5   Past Medical History:  Diagnosis Date  . Medical history non-contributory     Past Surgical History:  Procedure Laterality Date  . EYE SURGERY Left 11/2015   cataract  . HAND RECONSTRUCTION Right 2003   right  . TONSILLECTOMY      Prior to Admission medications   Medication Sig Start Date End Date Taking? Authorizing Provider  aspirin EC 81 MG tablet Take 162 mg by mouth daily.   Yes [provider]  gabapentin (NEURONTIN) 300 MG capsule Take 300 mg by mouth every 8 (eight) hours. 10/05/16  Yes [provider]  HYDROcodone-acetaminophen (NORCO) 10-325 MG tablet Take 1 tablet by mouth every 6 (six) hours as needed. For pain. 09/26/16  Yes [provider]  Polyethyl Glycol-Propyl Glycol (SYSTANE ULTRA) 0.4-0.3 % SOLN Place 1 drop into both eyes 2 (two) times daily.   Yes [provider]  Magnesium 250 MG TABS Take 500 mg by mouth daily.    [provider]  naproxen sodium (ANAPROX) 220 MG tablet Take 440-660 mg by mouth 2 (two) times daily as needed (for pain.).    [provider]  Potassium 99 MG TABS Take 99 mg by mouth daily.    [provider]   Allergies  Allergen Reactions  . Other Anaphylaxis    Certain preservatives for food and liquor - became grey and eyes rolled back in his head    Social History  Substance Use Topics  . Smoking status: Never Smoker  . Smokeless tobacco: Never Used  . Alcohol use 3.6 oz/week    6 Cans of beer per week    Family History  Problem Relation Age of Onset  . Colon cancer Neg Hx    . Stomach cancer Neg Hx      Review of Systems  Positive ROS: neg  All other systems have been reviewed and were otherwise negative with the exception of those mentioned in the HPI and as above.  Objective: Vital signs in last 24 hours: Temp:  [98.3 F (36.8 C)] 98.3 F (36.8 C) (08/30 1315) Pulse Rate:  [79] 79 (08/30 1315) Resp:  [20] 20 (08/30 1315) BP: (158)/(77) 158/77 (08/30 1315) SpO2:  [99 %] 99 % (08/30 1315) Weight:  [83 kg (183 lb)] 83 kg (183 lb) (08/30 1315)  General Appearance: Alert, cooperative, no distress, appears stated age Head: Normocephalic, without obvious abnormality, atraumatic Eyes: PERRL, conjunctiva/corneas clear, EOM's intact    Neck: Supple, symmetrical, trachea midline Back: Symmetric, no curvature, ROM normal, no CVA tenderness Lungs:  respirations unlabored Heart: Regular rate and rhythm Abdomen: Soft, non-tender Extremities: Extremities normal, atraumatic, no cyanosis or edema Pulses: 2+ and symmetric all extremities Skin: Skin color, texture, turgor normal, no rashes or lesions  NEUROLOGIC:   Mental status: Alert and oriented x4,  no aphasia, good attention span, fund of knowledge, and memory Motor Exam - grossly normal Sensory Exam - grossly normal Reflexes: 1+ Coordination - grossly normal Gait - grossly normal Balance - grossly normal Cranial Nerves: I: smell Not tested  II: visual acuity  OS: nl    OD: nl  II: visual fields Full to confrontation  II: pupils Equal, round, reactive to light  III,VII: ptosis None  III,IV,VI: extraocular muscles  Full ROM  V: mastication Normal  V: facial light touch sensation  Normal  V,VII: corneal reflex  Present  VII: facial muscle function - upper  Normal  VII: facial muscle function - lower Normal  VIII: hearing Not tested  IX: soft palate elevation  Normal  IX,X: gag reflex Present  XI: trapezius strength  5/5  XI: sternocleidomastoid strength 5/5  XI: neck flexion strength  5/5   XII: tongue strength  Normal    Data Review Lab Results  Component Value Date   WBC 5.2 10/20/2016   HGB 15.2 10/20/2016   HCT 44.1 10/20/2016   MCV 87.2 10/20/2016   PLT 273 10/20/2016   Lab Results  Component Value Date   NA 141 10/20/2016   K 3.6 10/20/2016   CL 106 10/20/2016   CO2 25 10/20/2016   BUN 12 10/20/2016   CREATININE 1.01 10/20/2016   GLUCOSE 140 (H) 10/20/2016   Lab Results  Component Value Date   INR 0.99 10/20/2016    Assessment/Plan: Patient admitted for LL/ diskectomy. Patient has failed a reasonable attempt at conservative therapy.  I explained the condition and procedure to the patient and answered any questions.  Patient wishes to proceed with procedure as planned. Understands risks/ benefits and typical outcomes of procedure.   Swara Donze,Doris S 10/26/2016 1:56 PM

## 2016-10-26 NOTE — Progress Notes (Signed)
Patient tolerated regular diet in PACU. Walked two laps around unit and was able to void in bathroom. Pain well controlled, wife at bedside for discharge instructions. Patient and wife ready to go home at this time.

## 2016-10-26 NOTE — Op Note (Signed)
10/26/2016  4:26 PM  PATIENT:  Eric Oliver  66 y.o. 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 Imaging showed stenosis L3-4 and L4-5 with a right lateral recess superior free fragment at L4-5. The patient tried conservative measures without relief. Pain was debilitating. Recommended decompressive hemilaminectomies at L3-4 and L4-5 to address the spinal stenosis followed by discectomy at L4-5 on the right. Patient understood the risks, benefits, and alternatives and potential outcomes and wished to proceed.  PROCEDURE DETAILS: The patient was taken to the operating room and after induction of adequate generalized endotracheal anesthesia, the patient was rolled into the prone position on the Wilson frame and all pressure points were padded. The lumbar region was cleaned and then prepped with DuraPrep and draped in the usual sterile fashion. 5 cc of local anesthesia was injected and then a dorsal midline incision was made and carried down to the lumbo sacral fascia. The fascia was opened and the paraspinous musculature was taken down in a subperiosteal fashion to expose L3-4 and L4-5 on the right. Intraoperative x-ray confirmed my level, and then I used a combination of the high-speed drill and the Kerrison punches to perform a hemilaminectomy, medial facetectomy, and  foraminotomy at L3-4 and L4-5 on the right. The underlying yellow ligament was opened and removed in a piecemeal fashion to expose the underlying dura and exiting nerve root. I undercut the lateral recess and dissected down until I was medial to and distal to the pedicle. The nerve root was well decompressed at both levels. At L4-5 I carried the laminectomy superiorly and a little bit laterally to identify both the L4 and L5 nerve roots. I found a disc herniation at L4-5 on the right with superior free fragment underneath the L4 nerve root. We then gently retracted the nerve root medially with a retractor, coagulated the epidural venous vasculature, and incised superior to the L4-5 disc space. A performed a thorough  discectomy with pituitary rongeurs and a nerve hook and coronary dilator underneath the L4 nerve root, until I had a nice decompression of the nerve root and no more free fragments were found. I then palpated with a coronary dilator along the nerve root and into the foramen to assure adequate decompression. I felt no more compression of the nerve root. At L3-4 I drilled up under the spinous process and performed a sublaminar decompression to decompress the central canal and the left lateral recess. I irrigated with saline solution containing bacitracin. Achieved hemostasis with bipolar cautery, lined the dura with Gelfoam, and then closed the fascia with 0 Vicryl. I closed the subcutaneous tissues with 2-0 Vicryl and the subcuticular tissues with 3-0 Vicryl. The skin was then closed with benzoin and Steri-Strips. The drapes were removed, a sterile dressing was applied. The patient was awakened from general anesthesia and transferred to the recovery room in stable condition. At the end of the procedure all  sponge, needle and instrument counts were correct.    PLAN OF CARE: Admit for overnight observation  PATIENT DISPOSITION:  PACU - hemodynamically stable.   Delay start of Pharmacological VTE  agent (>24hrs) due to surgical blood loss or risk of bleeding:  yes

## 2016-10-26 NOTE — Transfer of Care (Signed)
Immediate Anesthesia Transfer of Care Note  Patient: Stann MainlandDavid P Glad  Procedure(s) Performed: Procedure(s): LUMBAR LAMINECTOMY AND FORAMINOTOMY LUMBAR THREE-LUMBAR FOUR, LUMBAR FOUR-LUMBAR FIVE, RIGHT MICRODISCECTOMY LUMBAR FOUR-FIVE RIGHT (Right)  Patient Location: PACU  Anesthesia Type:General  Level of Consciousness: awake, alert  and oriented  Airway & Oxygen Therapy: Patient Spontanous Breathing and Patient connected to nasal cannula oxygen  Post-op Assessment: Report given to RN, Post -op Vital signs reviewed and stable and Patient moving all extremities X 4  Post vital signs: Reviewed and stable  Last Vitals:  Vitals:   10/26/16 1315 10/26/16 1645  BP: (!) 158/77 131/81  Pulse: 79 76  Resp: 20 (!) 8  Temp: 36.8 C (!) 36.3 C  SpO2: 99% 98%    Last Pain:  Vitals:   10/26/16 1645  TempSrc:   PainSc: 4       Patients Stated Pain Goal: 3 (10/26/16 1315)  Complications: No apparent anesthesia complications

## 2016-10-26 NOTE — Anesthesia Preprocedure Evaluation (Signed)
Anesthesia Evaluation  Patient identified by MRN, date of birth, ID band Patient awake    Reviewed: Allergy & Precautions, NPO status , Patient's Chart, lab work & pertinent test results  Airway Mallampati: II  TM Distance: >3 FB Neck ROM: Full    Dental no notable dental hx.    Pulmonary neg pulmonary ROS,    Pulmonary exam normal breath sounds clear to auscultation       Cardiovascular negative cardio ROS Normal cardiovascular exam Rhythm:Regular Rate:Normal     Neuro/Psych negative neurological ROS  negative psych ROS   GI/Hepatic negative GI ROS, Neg liver ROS,   Endo/Other  negative endocrine ROS  Renal/GU negative Renal ROS  negative genitourinary   Musculoskeletal negative musculoskeletal ROS (+)   Abdominal   Peds negative pediatric ROS (+)  Hematology negative hematology ROS (+)   Anesthesia Other Findings   Reproductive/Obstetrics negative OB ROS                             Anesthesia Physical Anesthesia Plan  ASA: II  Anesthesia Plan: General   Post-op Pain Management:    Induction: Intravenous  PONV Risk Score and Plan: 1 and Ondansetron and Dexamethasone  Airway Management Planned: Oral ETT  Additional Equipment:   Intra-op Plan:   Post-operative Plan: Extubation in OR  Informed Consent: I have reviewed the patients History and Physical, chart, labs and discussed the procedure including the risks, benefits and alternatives for the proposed anesthesia with the patient or authorized representative who has indicated his/her understanding and acceptance.   Dental advisory given  Plan Discussed with: CRNA and Surgeon  Anesthesia Plan Comments:         Anesthesia Quick Evaluation  

## 2016-10-26 NOTE — Anesthesia Procedure Notes (Signed)
Procedure Name: Intubation Date/Time: 10/26/2016 2:50 PM Performed by: Rejeana Brock L Pre-anesthesia Checklist: Patient identified, Emergency Drugs available, Suction available and Patient being monitored Patient Re-evaluated:Patient Re-evaluated prior to induction Oxygen Delivery Method: Circle System Utilized Preoxygenation: Pre-oxygenation with 100% oxygen Induction Type: IV induction Ventilation: Mask ventilation without difficulty Laryngoscope Size: Mac and 4 Grade View: Grade I Tube type: Oral Number of attempts: 1 Airway Equipment and Method: Stylet and Oral airway Placement Confirmation: ETT inserted through vocal cords under direct vision,  positive ETCO2 and breath sounds checked- equal and bilateral Secured at: 22 cm Tube secured with: Tape Dental Injury: Teeth and Oropharynx as per pre-operative assessment

## 2016-10-26 NOTE — Discharge Summary (Signed)
Physician Discharge Summary  Patient ID: Eric Oliver MRN: [id number] DOB/AGE: 66-Dec-1952 66 y.o.  Admit date: 10/26/2016 Discharge date: 10/26/2016  Admission Diagnoses: lumbar stenosis/ HNP    Discharge Diagnoses: same   Discharged Condition: good  Hospital Course: The patient was admitted on 10/26/2016 and taken to the operating room where the patient underwent LL/ diskectomy. The patient tolerated the procedure well and was taken to the recovery room and then to the floor in stable condition. The hospital course was routine. There were no complications. The wound remained clean dry and intact. Pt had appropriate back soreness. No complaints of leg pain or new N/T/W. The patient remained afebrile with stable vital signs, and tolerated a regular diet. The patient continued to increase activities, and pain was well controlled with oral pain medications.   Consults: None  Significant Diagnostic Studies:  Results for orders placed or performed during the hospital encounter of 10/20/16  Surgical pcr screen  Result Value Ref Range   MRSA, PCR NEGATIVE NEGATIVE   Staphylococcus aureus NEGATIVE NEGATIVE  Basic metabolic panel  Result Value Ref Range   Sodium 141 135 - 145 mmol/L   Potassium 3.6 3.5 - 5.1 mmol/L   Chloride 106 101 - 111 mmol/L   CO2 25 22 - 32 mmol/L   Glucose, Bld 140 (H) 65 - 99 mg/dL   BUN 12 6 - 20 mg/dL   Creatinine, Ser 1.19 0.61 - 1.24 mg/dL   Calcium 9.4 8.9 - 14.7 mg/dL   GFR calc non Af Amer >60 >60 mL/min   GFR calc Af Amer >60 >60 mL/min   Anion gap 10 5 - 15  CBC WITH DIFFERENTIAL  Result Value Ref Range   WBC 5.2 4.0 - 10.5 K/uL   RBC 5.06 4.22 - 5.81 MIL/uL   Hemoglobin 15.2 13.0 - 17.0 g/dL   HCT 82.9 56.2 - 13.0 %   MCV 87.2 78.0 - 100.0 fL   MCH 30.0 26.0 - 34.0 pg   MCHC 34.5 30.0 - 36.0 g/dL   RDW 86.5 78.4 - 69.6 %   Platelets 273 150 - 400 K/uL   Neutrophils Relative % 64 %   Neutro Abs 3.4 1.7 - 7.7 K/uL   Lymphocytes Relative  25 %   Lymphs Abs 1.3 0.7 - 4.0 K/uL   Monocytes Relative 9 %   Monocytes Absolute 0.4 0.1 - 1.0 K/uL   Eosinophils Relative 2 %   Eosinophils Absolute 0.1 0.0 - 0.7 K/uL   Basophils Relative 0 %   Basophils Absolute 0.0 0.0 - 0.1 K/uL  Protime-INR  Result Value Ref Range   Prothrombin Time 13.1 11.4 - 15.2 seconds   INR 0.99     Chest 2 View  Result Date: 10/20/2016 CLINICAL DATA:  Lumbar disc herniation.  Pre-op respiratory exam EXAM: CHEST  2 VIEW COMPARISON:  None. FINDINGS: The heart size and mediastinal contours are within normal limits. Both lungs are clear. The visualized skeletal structures are unremarkable. IMPRESSION: No active cardiopulmonary disease. Electronically Signed   By: Myles Rosenthal M.D.   On: 10/20/2016 16:24   Mr Lumbar Spine Wo Contrast  Result Date: 10/01/2016 CLINICAL DATA:  Chronic low back pain, RIGHT hip and leg pain and numbness, burning sensation with stretching. EXAM: MRI LUMBAR SPINE WITHOUT CONTRAST TECHNIQUE: Multiplanar, multisequence MR imaging of the lumbar spine was performed. No intravenous contrast was administered. COMPARISON:  None. FINDINGS: SEGMENTATION: For the purposes of this report, the last well-formed intervertebral disc will be reported  as L5-S1. ALIGNMENT: Maintained lumbar lordosis. No malalignment. VERTEBRAE:Vertebral bodies are intact. Moderate L1-2 thru L4-5 disc height loss with desiccation and mild to moderate chronic discogenic endplate changes compatible with degenerative discs. Superimposed acute component L3-4. No suspicious bone marrow signal. CONUS MEDULLARIS: Conus medullaris terminates at T12-L1 and demonstrates normal morphology and signal characteristics. Cauda equina is normal. PARASPINAL AND SOFT TISSUES: Included prevertebral and paraspinal soft tissues are normal. DISC LEVELS: T12-L1: No disc bulge, canal stenosis nor neural foraminal narrowing. L1-2: 2 mm broad-based disc bulge asymmetric to the RIGHT. No canal stenosis or  neural foraminal narrowing. L2-3: 4 mm broad-based disc bulge. No canal stenosis or neural foraminal narrowing. L3-4: 5 mm broad-based disc bulge asymmetric to the LEFT encroaches upon the exited LEFT L5 nerve. Mild facet arthropathy and ligamentum flavum redundancy. No canal stenosis though, partial effacement LEFT lateral recess may affect the traversing LEFT L4 nerve. Mild RIGHT, mild to moderate LEFT neural foraminal narrowing. L4-5: 4 mm broad-based disc bulge, superimposed RIGHT subarticular disc protrusion and extrusion with 9 x 8 x 16 mm (transverse by AP by CC) free fragment RIGHT lateral epidural space, displacing the traversing RIGHT L4 nerve. Moderate facet arthropathy and ligamentum flavum redundancy without canal stenosis with partial effacement of RIGHT lateral recess may affect the traversing RIGHT L5 nerve. Disc extrusion effaces the RIGHT neural foramen. Mild to moderate LEFT neural foraminal narrowing. L5-S1: No disc bulge. Mild to moderate facet arthropathy without canal stenosis or neural foraminal narrowing. IMPRESSION: 1. L4-5 9 x 8 x 16 mm disc extrusion/ free fragment resulting in RIGHT L4 nerve impingement and complete effacement of RIGHT L4-5 neural foramen. 2. Degenerative change of the lumbar spine. No canal stenosis. Lateral recess stenosis L4-5 may affect the traversing RIGHT L5 nerve. 3. Mild to moderate LEFT L3-4 and L4-5 neural foraminal narrowing. Electronically Signed   By: Awilda Metroourtnay  Bloomer M.D.   On: 10/01/2016 21:15   Dg Lumbar Spine 1 View  Result Date: 10/26/2016 CLINICAL DATA:  Lumbar surgery. EXAM: LUMBAR SPINE - 1 VIEW COMPARISON:  MRI 10/01/2016 . FINDINGS: Lumbar spine numbered as per prior MRI. Metallic marker noted posteriorly at the L4-L5 disc space level. IMPRESSION: Metallic marker noted posteriorly at the L4-L5 disc space level. Electronically Signed   By: Maisie Fushomas  Register   On: 10/26/2016 15:50    Antibiotics:  Anti-infectives    Start     Dose/Rate Route  Frequency Ordered Stop   10/27/16 0600  ceFAZolin (ANCEF) IVPB 2g/100 mL premix     2 g 200 mL/hr over 30 Minutes Intravenous On call to O.R. 10/26/16 1303 10/26/16 1452   10/26/16 1527  bacitracin 50,000 Units in sodium chloride irrigation 0.9 % 500 mL irrigation  Status:  Discontinued       As needed 10/26/16 1527 10/26/16 1641      Discharge Exam: Blood pressure 131/81, pulse 76, temperature (!) 97.3 F (36.3 C), resp. rate (!) 8, height 5\' 8"  (1.727 m), weight 83 kg (183 lb), SpO2 98 %. Neurologic: Grossly normal Dressing dry  Discharge Medications:   Allergies as of 10/26/2016      Reactions   Other Anaphylaxis   Certain preservatives for food and liquor - became grey and eyes rolled back in his head      Medication List    TAKE these medications   aspirin EC 81 MG tablet Take 162 mg by mouth daily.   gabapentin 300 MG capsule Commonly known as:  NEURONTIN Take 300 mg by mouth  every 8 (eight) hours.   HYDROcodone-acetaminophen 10-325 MG tablet Commonly known as:  NORCO Take 1 tablet by mouth every 6 (six) hours as needed. For pain.   Magnesium 250 MG Tabs Take 500 mg by mouth daily.   naproxen sodium 220 MG tablet Commonly known as:  ANAPROX Take 440-660 mg by mouth 2 (two) times daily as needed (for pain.).   Potassium 99 MG Tabs Take 99 mg by mouth daily.   SYSTANE ULTRA 0.4-0.3 % Soln Generic drug:  Polyethyl Glycol-Propyl Glycol Place 1 drop into both eyes 2 (two) times daily.            Discharge Care Instructions        Start     Ordered   10/26/16 0000  Increase activity slowly     10/26/16 1702   10/26/16 0000  Diet - low sodium heart healthy     10/26/16 1702   10/26/16 0000   Remove dressing in 72 hours     10/26/16 1702   10/26/16 0000  Call MD for:  temperature >100.4     10/26/16 1702   10/26/16 0000  Call MD for:  persistant nausea and vomiting     10/26/16 1702   10/26/16 0000  Call MD for:  severe uncontrolled pain      10/26/16 1702   10/26/16 0000  Call MD for:  redness, tenderness, or signs of infection (pain, swelling, redness, odor or green/yellow discharge around incision site)     10/26/16 1702   10/26/16 0000  Call MD for:  difficulty breathing, headache or visual disturbances     10/26/16 1702      Disposition: home   Final Dx: LL for stenosis  Discharge Instructions     Remove dressing in 72 hours    Complete by:  As directed    Call MD for:  difficulty breathing, headache or visual disturbances    Complete by:  As directed    Call MD for:  persistant nausea and vomiting    Complete by:  As directed    Call MD for:  redness, tenderness, or signs of infection (pain, swelling, redness, odor or green/yellow discharge around incision site)    Complete by:  As directed    Call MD for:  severe uncontrolled pain    Complete by:  As directed    Call MD for:  temperature >100.4    Complete by:  As directed    Diet - low sodium heart healthy    Complete by:  As directed    Increase activity slowly    Complete by:  As directed          Signed: Buren Havey,Abrahm S 10/26/2016, 5:02 PM

## 2016-10-27 ENCOUNTER — Encounter (HOSPITAL_COMMUNITY): Payer: Self-pay | Admitting: Neurological Surgery

## 2016-10-27 MED FILL — Thrombin For Soln 5000 Unit: CUTANEOUS | Qty: 2 | Status: AC

## 2016-10-27 NOTE — Anesthesia Postprocedure Evaluation (Signed)
Anesthesia Post Note  Patient: Eric Oliver  Procedure(s) Performed: Procedure(s) (LRB): LUMBAR LAMINECTOMY AND FORAMINOTOMY LUMBAR THREE-LUMBAR FOUR, LUMBAR FOUR-LUMBAR FIVE, RIGHT MICRODISCECTOMY LUMBAR FOUR-FIVE RIGHT (Right)     Patient location during evaluation: PACU Anesthesia Type: General Level of consciousness: awake and alert Pain management: pain level controlled Vital Signs Assessment: post-procedure vital signs reviewed and stable Respiratory status: spontaneous breathing, nonlabored ventilation, respiratory function stable and patient connected to nasal cannula oxygen Cardiovascular status: blood pressure returned to baseline and stable Postop Assessment: no signs of nausea or vomiting Anesthetic complications: no    Last Vitals:  Vitals:   10/26/16 1830 10/26/16 1900  BP: 132/78 (!) 147/80  Pulse: 76 79  Resp: 16 16  Temp:  (!) 36.3 C  SpO2: 93% 95%    Last Pain:  Vitals:   10/26/16 1900  TempSrc:   PainSc: 5                  Ahnyla Mendel S

## 2017-11-01 DIAGNOSIS — H04123 Dry eye syndrome of bilateral lacrimal glands: Secondary | ICD-10-CM | POA: Diagnosis not present

## 2017-11-01 DIAGNOSIS — H35413 Lattice degeneration of retina, bilateral: Secondary | ICD-10-CM | POA: Diagnosis not present

## 2017-11-01 DIAGNOSIS — H2511 Age-related nuclear cataract, right eye: Secondary | ICD-10-CM | POA: Diagnosis not present

## 2017-11-01 DIAGNOSIS — H5203 Hypermetropia, bilateral: Secondary | ICD-10-CM | POA: Diagnosis not present

## 2018-08-05 DIAGNOSIS — H26492 Other secondary cataract, left eye: Secondary | ICD-10-CM | POA: Diagnosis not present

## 2018-08-16 DIAGNOSIS — H26493 Other secondary cataract, bilateral: Secondary | ICD-10-CM | POA: Diagnosis not present

## 2018-08-28 DIAGNOSIS — H26492 Other secondary cataract, left eye: Secondary | ICD-10-CM | POA: Diagnosis not present

## 2019-06-25 IMAGING — CR DG LUMBAR SPINE 1V
1 series · 1 of 1 positions shown · non-contrast
Comparison: MRI 10/01/2016 .

CLINICAL DATA: Lumbar surgery.

EXAM:
LUMBAR SPINE - 1 VIEW

[xtable lateral]
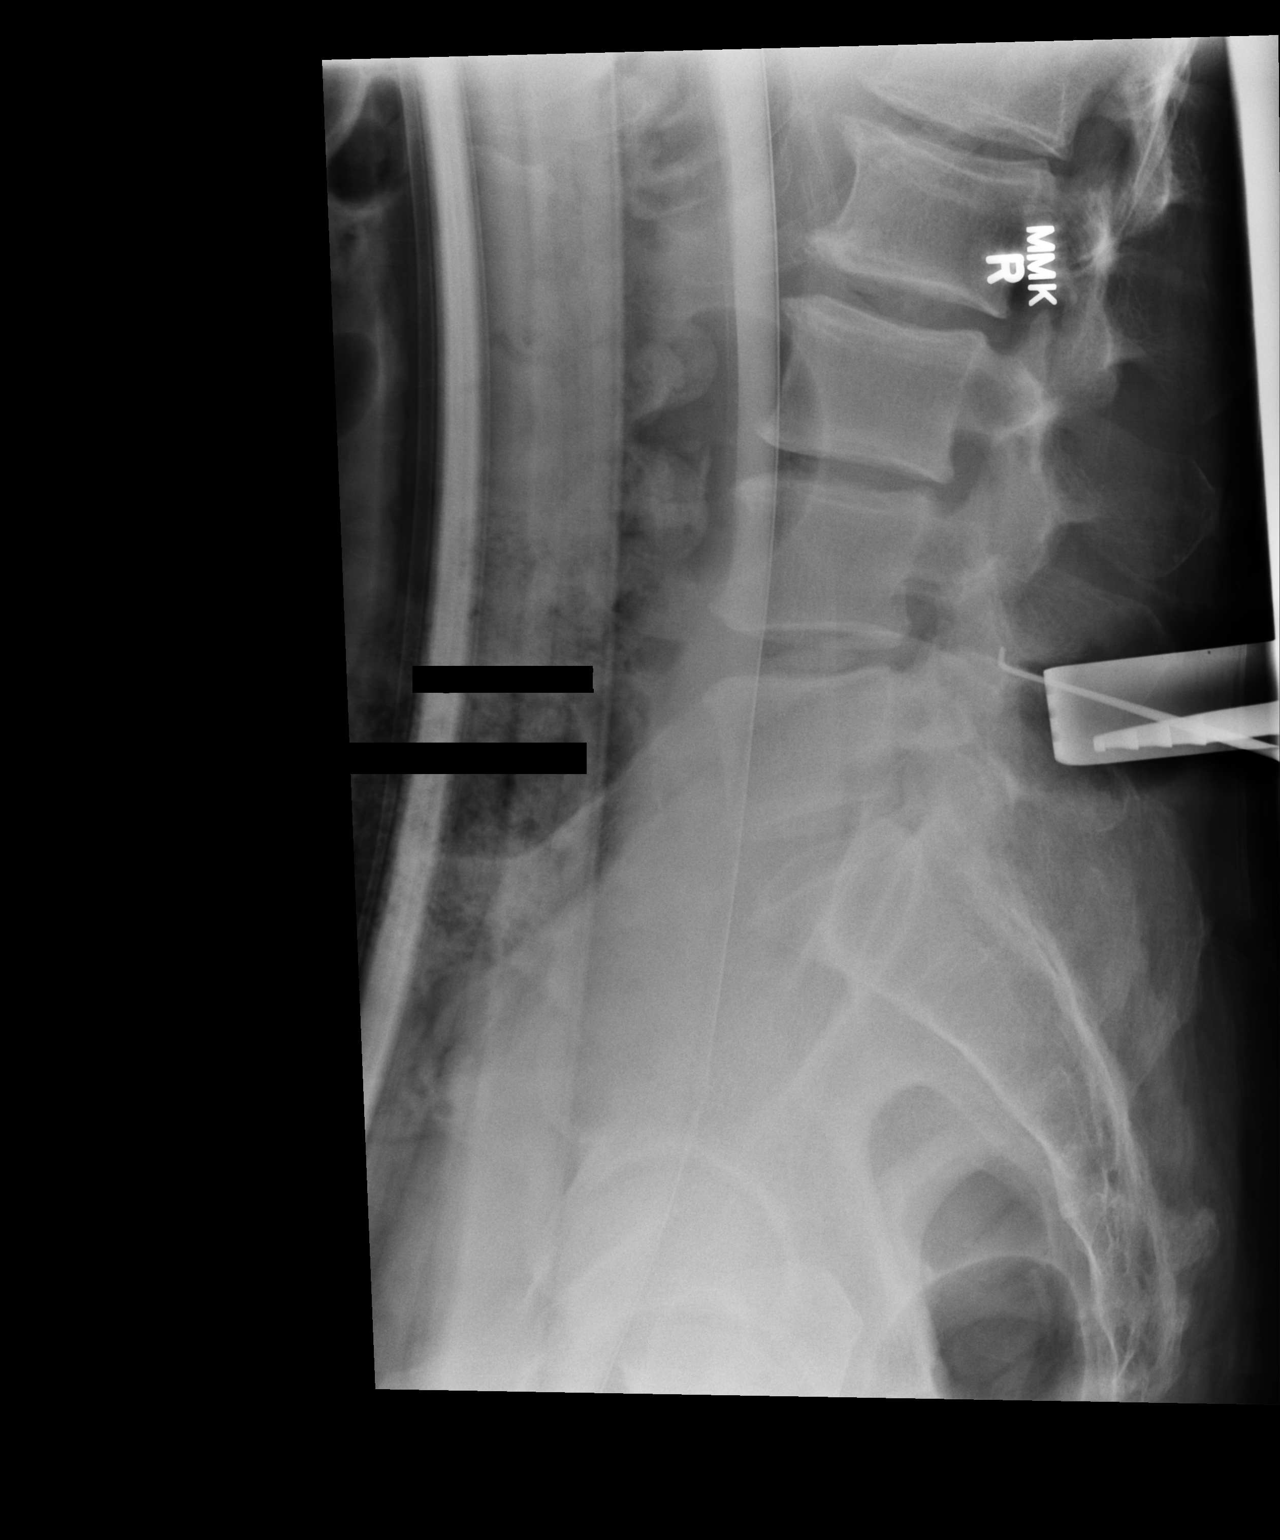

[1 of 1 positions shown; findings below may reference images not displayed]

FINDINGS: Lumbar spine numbered as per prior MRI. Metallic marker noted
posteriorly at the L4-L5 disc space level.
IMPRESSION: Metallic marker noted posteriorly at the L4-L5 disc space level.

## 2019-12-29 DIAGNOSIS — H4312 Vitreous hemorrhage, left eye: Secondary | ICD-10-CM | POA: Diagnosis not present

## 2019-12-29 DIAGNOSIS — H33302 Unspecified retinal break, left eye: Secondary | ICD-10-CM | POA: Diagnosis not present

## 2019-12-29 DIAGNOSIS — H43821 Vitreomacular adhesion, right eye: Secondary | ICD-10-CM | POA: Diagnosis not present

## 2019-12-29 DIAGNOSIS — H43812 Vitreous degeneration, left eye: Secondary | ICD-10-CM | POA: Diagnosis not present

## 2019-12-29 DIAGNOSIS — H35372 Puckering of macula, left eye: Secondary | ICD-10-CM | POA: Diagnosis not present

## 2019-12-29 DIAGNOSIS — H33322 Round hole, left eye: Secondary | ICD-10-CM | POA: Diagnosis not present

## 2019-12-29 DIAGNOSIS — H33312 Horseshoe tear of retina without detachment, left eye: Secondary | ICD-10-CM | POA: Diagnosis not present

## 2019-12-29 DIAGNOSIS — H43813 Vitreous degeneration, bilateral: Secondary | ICD-10-CM | POA: Diagnosis not present

## 2019-12-29 DIAGNOSIS — H35412 Lattice degeneration of retina, left eye: Secondary | ICD-10-CM | POA: Diagnosis not present

## 2020-01-05 DIAGNOSIS — H33312 Horseshoe tear of retina without detachment, left eye: Secondary | ICD-10-CM | POA: Diagnosis not present

## 2020-01-05 DIAGNOSIS — H4312 Vitreous hemorrhage, left eye: Secondary | ICD-10-CM | POA: Diagnosis not present

## 2020-01-26 DIAGNOSIS — H4312 Vitreous hemorrhage, left eye: Secondary | ICD-10-CM | POA: Diagnosis not present

## 2020-01-26 DIAGNOSIS — H35421 Microcystoid degeneration of retina, right eye: Secondary | ICD-10-CM | POA: Diagnosis not present

## 2020-03-22 DIAGNOSIS — H4312 Vitreous hemorrhage, left eye: Secondary | ICD-10-CM | POA: Diagnosis not present

## 2020-03-22 DIAGNOSIS — H33322 Round hole, left eye: Secondary | ICD-10-CM | POA: Diagnosis not present

## 2020-03-22 DIAGNOSIS — H33312 Horseshoe tear of retina without detachment, left eye: Secondary | ICD-10-CM | POA: Diagnosis not present

## 2020-05-05 DIAGNOSIS — H524 Presbyopia: Secondary | ICD-10-CM | POA: Diagnosis not present

## 2020-05-05 DIAGNOSIS — H5203 Hypermetropia, bilateral: Secondary | ICD-10-CM | POA: Diagnosis not present

## 2020-05-05 DIAGNOSIS — H33302 Unspecified retinal break, left eye: Secondary | ICD-10-CM | POA: Diagnosis not present

## 2020-09-20 DIAGNOSIS — H43821 Vitreomacular adhesion, right eye: Secondary | ICD-10-CM | POA: Diagnosis not present

## 2020-09-20 DIAGNOSIS — H43813 Vitreous degeneration, bilateral: Secondary | ICD-10-CM | POA: Diagnosis not present

## 2020-09-20 DIAGNOSIS — H33312 Horseshoe tear of retina without detachment, left eye: Secondary | ICD-10-CM | POA: Diagnosis not present

## 2020-09-20 DIAGNOSIS — H35372 Puckering of macula, left eye: Secondary | ICD-10-CM | POA: Diagnosis not present

## 2020-09-20 DIAGNOSIS — H33322 Round hole, left eye: Secondary | ICD-10-CM | POA: Diagnosis not present

## 2021-03-07 ENCOUNTER — Other Ambulatory Visit: Payer: Self-pay

## 2021-03-07 ENCOUNTER — Ambulatory Visit (INDEPENDENT_AMBULATORY_CARE_PROVIDER_SITE_OTHER): Payer: PPO | Admitting: Internal Medicine

## 2021-03-07 ENCOUNTER — Encounter: Payer: Self-pay | Admitting: Internal Medicine

## 2021-03-07 VITALS — BP 158/88 | HR 82 | Temp 98.2°F | Resp 16 | Ht 68.0 in | Wt 189.0 lb

## 2021-03-07 DIAGNOSIS — I10 Essential (primary) hypertension: Secondary | ICD-10-CM

## 2021-03-07 DIAGNOSIS — Z1159 Encounter for screening for other viral diseases: Secondary | ICD-10-CM

## 2021-03-07 DIAGNOSIS — N400 Enlarged prostate without lower urinary tract symptoms: Secondary | ICD-10-CM | POA: Diagnosis not present

## 2021-03-07 DIAGNOSIS — Z23 Encounter for immunization: Secondary | ICD-10-CM | POA: Diagnosis not present

## 2021-03-07 DIAGNOSIS — Z0001 Encounter for general adult medical examination with abnormal findings: Secondary | ICD-10-CM | POA: Diagnosis not present

## 2021-03-07 DIAGNOSIS — K4090 Unilateral inguinal hernia, without obstruction or gangrene, not specified as recurrent: Secondary | ICD-10-CM | POA: Diagnosis not present

## 2021-03-07 DIAGNOSIS — Z0000 Encounter for general adult medical examination without abnormal findings: Secondary | ICD-10-CM | POA: Insufficient documentation

## 2021-03-07 DIAGNOSIS — Z1211 Encounter for screening for malignant neoplasm of colon: Secondary | ICD-10-CM | POA: Insufficient documentation

## 2021-03-07 DIAGNOSIS — R972 Elevated prostate specific antigen [PSA]: Secondary | ICD-10-CM

## 2021-03-07 DIAGNOSIS — Z01818 Encounter for other preprocedural examination: Secondary | ICD-10-CM | POA: Insufficient documentation

## 2021-03-07 DIAGNOSIS — Z114 Encounter for screening for human immunodeficiency virus [HIV]: Secondary | ICD-10-CM | POA: Insufficient documentation

## 2021-03-07 DIAGNOSIS — E785 Hyperlipidemia, unspecified: Secondary | ICD-10-CM | POA: Diagnosis not present

## 2021-03-07 LAB — BASIC METABOLIC PANEL
BUN: 20 mg/dL (ref 6–23)
CO2: 24 mEq/L (ref 19–32)
Calcium: 9.1 mg/dL (ref 8.4–10.5)
Chloride: 104 mEq/L (ref 96–112)
Creatinine, Ser: 0.85 mg/dL (ref 0.40–1.50)
GFR: 88.1 mL/min (ref >60.00)
Glucose, Bld: 99 mg/dL (ref 70–99)
Potassium: 4.2 mEq/L (ref 3.5–5.1)
Sodium: 139 mEq/L (ref 135–145)

## 2021-03-07 LAB — CBC WITH DIFFERENTIAL/PLATELET
Basophils Absolute: 0 10*3/uL (ref 0.0–0.1)
Basophils Relative: 0.7 % (ref 0.0–3.0)
Eosinophils Absolute: 0.2 10*3/uL (ref 0.0–0.7)
Eosinophils Relative: 2.5 % (ref 0.0–5.0)
HCT: 46.4 % (ref 39.0–52.0)
Hemoglobin: 15.3 g/dL (ref 13.0–17.0)
Lymphocytes Relative: 19.5 % (ref 12.0–46.0)
Lymphs Abs: 1.2 10*3/uL (ref 0.7–4.0)
MCHC: 33 g/dL (ref 30.0–36.0)
MCV: 88.7 fl (ref 78.0–100.0)
Monocytes Absolute: 0.6 10*3/uL (ref 0.1–1.0)
Monocytes Relative: 10.1 % (ref 3.0–12.0)
Neutro Abs: 4.3 10*3/uL (ref 1.4–7.7)
Neutrophils Relative %: 67.2 % (ref 43.0–77.0)
Platelets: 309 10*3/uL (ref 150.0–400.0)
RBC: 5.23 Mil/uL (ref 4.22–5.81)
RDW: 12.6 % (ref 11.5–15.5)
WBC: 6.3 10*3/uL (ref 4.0–10.5)

## 2021-03-07 LAB — URINALYSIS, ROUTINE W REFLEX MICROSCOPIC
Bilirubin Urine: NEGATIVE
Leukocytes,Ua: NEGATIVE
Nitrite: NEGATIVE
Specific Gravity, Urine: >=1.03 — AB (ref 1.000–1.030)
Total Protein, Urine: NEGATIVE
Urine Glucose: NEGATIVE
Urobilinogen, UA: 0.2 (ref 0.0–1.0)
pH: 5.5 (ref 5.0–8.0)

## 2021-03-07 LAB — LIPID PANEL
Cholesterol: 274 mg/dL — ABNORMAL HIGH (ref 0–200)
HDL: 59.2 mg/dL (ref >39.00)
LDL Cholesterol: 183 mg/dL — ABNORMAL HIGH (ref 0–99)
NonHDL: 214.34
Total CHOL/HDL Ratio: 5
Triglycerides: 155 mg/dL — ABNORMAL HIGH (ref 0.0–149.0)
VLDL: 31 mg/dL (ref 0.0–40.0)

## 2021-03-07 LAB — TSH: TSH: 2.89 u[IU]/mL (ref 0.35–5.50)

## 2021-03-07 LAB — HEPATIC FUNCTION PANEL
ALT: 14 U/L (ref 0–53)
AST: 12 U/L (ref 0–37)
Albumin: 4.6 g/dL (ref 3.5–5.2)
Alkaline Phosphatase: 59 U/L (ref 39–117)
Bilirubin, Direct: 0.1 mg/dL (ref 0.0–0.3)
Total Bilirubin: 0.5 mg/dL (ref 0.2–1.2)
Total Protein: 7.4 g/dL (ref 6.0–8.3)

## 2021-03-07 LAB — PSA: PSA: 4.6 ng/mL — ABNORMAL HIGH (ref 0.10–4.00)

## 2021-03-07 MED ORDER — OLMESARTAN MEDOXOMIL 20 MG PO TABS: 20.0000 mg | ORAL_TABLET | Freq: Every day | ORAL | 0 refills | Status: DC

## 2021-03-07 MED ORDER — ROSUVASTATIN CALCIUM 20 MG PO TABS: 20.0000 mg | ORAL_TABLET | Freq: Every day | ORAL | 1 refills | Status: DC

## 2021-03-07 MED ORDER — BOOSTRIX 5-2.5-18.5 LF-MCG/0.5 IM SUSP: 0.5000 mL | Freq: Once | INTRAMUSCULAR | 0 refills | Status: AC

## 2021-03-07 NOTE — Patient Instructions (Signed)

## 2021-03-07 NOTE — Progress Notes (Signed)
Subjective:  Patient ID: Eric Oliver, male    DOB: 12-Jan-1951  Age: 71 y.o. MRN: [id number]  CC: Annual Exam and Hypertension  This visit occurred during the SARS-CoV-2 public health emergency.  Safety protocols were in place, including screening questions prior to the visit, additional usage of staff PPE, and extensive cleaning of exam room while observing appropriate contact time as indicated for disinfecting solutions.    HPI Eric Oliver presents for a CPX and to establish.  He complains of a history of squishy sensation in his left groin.  There is no discomfort associated with it.  He is active (plays golf) and denies chest pain, shortness of breath, diaphoresis, dizziness, or lightheadedness.  History Eric Oliver has a past medical history of Medical history non-contributory.   He has a past surgical history that includes Hand reconstruction (Right, 2003); Tonsillectomy; Eye surgery (Left, 11/2015); and Lumbar laminectomy/decompression microdiscectomy (Right, 10/26/2016).   His family history is not on file.He reports that he has never smoked. He has never used smokeless tobacco. He reports that he does not currently use alcohol after a past usage of about 6.0 standard drinks per week. He reports that he does not use drugs.  Outpatient Medications Prior to Visit  Medication Sig Dispense Refill   aspirin EC 81 MG tablet Take 162 mg by mouth daily.     gabapentin (NEURONTIN) 300 MG capsule Take 300 mg by mouth every 8 (eight) hours.     HYDROcodone-acetaminophen (NORCO) 10-325 MG tablet Take 1 tablet by mouth every 6 (six) hours as needed. For pain.     Magnesium 250 MG TABS Take 500 mg by mouth daily.     naproxen sodium (ANAPROX) 220 MG tablet Take 440-660 mg by mouth 2 (two) times daily as needed (for pain.).     Polyethyl Glycol-Propyl Glycol 0.4-0.3 % SOLN Place 1 drop into both eyes 2 (two) times daily.     Potassium 99 MG TABS Take 99 mg by mouth daily.     No  facility-administered medications prior to visit.    ROS Review of Systems  Constitutional:  Negative for diaphoresis, fatigue and unexpected weight change.  HENT: Negative.    Eyes: Negative.  Negative for visual disturbance.  Respiratory:  Negative for apnea, cough, shortness of breath and wheezing.   Cardiovascular:  Negative for chest pain, palpitations and leg swelling.  Gastrointestinal:  Negative for abdominal pain, blood in stool, constipation, diarrhea, nausea and vomiting.  Endocrine: Negative.   Genitourinary: Negative.  Negative for difficulty urinating, hematuria, scrotal swelling and testicular pain.  Musculoskeletal: Negative.   Skin: Negative.  Negative for rash.  Neurological:  Negative for dizziness, weakness and light-headedness.  Hematological:  Negative for adenopathy. Does not bruise/bleed easily.  Psychiatric/Behavioral: Negative.     Objective:  BP (!) 158/88 (BP Location: Right Arm, Patient Position: Sitting, Cuff Size: Large) Comment: BP (R) 166/78 (L) 158/88   Pulse 82    Temp 98.2 F (36.8 C) (Oral)    Resp 16    Ht 5\' 8"  (1.727 m)    Wt 189 lb (85.7 kg)    SpO2 97%    BMI 28.74 kg/m   Physical Exam Vitals reviewed.  Constitutional:      Appearance: Normal appearance.  HENT:     Nose: Nose normal.     Mouth/Throat:     Mouth: Mucous membranes are moist.  Eyes:     General: No scleral icterus.    Conjunctiva/sclera: Conjunctivae normal.  Cardiovascular:     Rate and Rhythm: Normal rate and regular rhythm.     Heart sounds: Normal heart sounds, S1 normal and S2 normal. No murmur heard.   No gallop.     Comments: EKG- NSR, 76 bpm No LVH or Q waves Normal EKG Pulmonary:     Effort: Pulmonary effort is normal.     Breath sounds: No stridor. No wheezing, rhonchi or rales.  Abdominal:     General: Abdomen is flat.     Palpations: There is no mass.     Tenderness: There is no abdominal tenderness. There is no guarding.     Hernia: No hernia is  present. There is no hernia in the left inguinal area or right inguinal area.  Genitourinary:    Pubic Area: No rash.      Penis: Normal and circumcised.      Testes: Normal.        Right: Mass not present.        Left: Mass not present.     Epididymis:     Right: Normal. Not inflamed or enlarged. No mass.     Left: Normal. Not inflamed or enlarged. No mass.     Prostate: Enlarged. Not tender and no nodules present.     Rectum: Normal. Guaiac result negative. No mass, tenderness, anal fissure, external hemorrhoid or internal hemorrhoid. Normal anal tone.  Musculoskeletal:        General: Normal range of motion.     Cervical back: Neck supple.     Right lower leg: No edema.     Left lower leg: No edema.  Lymphadenopathy:     Cervical: No cervical adenopathy.     Lower Body: No right inguinal adenopathy. No left inguinal adenopathy.  Skin:    General: Skin is warm and dry.     Findings: No rash.  Neurological:     General: No focal deficit present.     Mental Status: He is alert. Mental status is at baseline.  Psychiatric:        Mood and Affect: Mood normal.        Behavior: Behavior normal.   Lab Results  Component Value Date   WBC 6.3 03/07/2021   HGB 15.3 03/07/2021   HCT 46.4 03/07/2021   PLT 309.0 03/07/2021   GLUCOSE 99 03/07/2021   CHOL 274 (H) 03/07/2021   TRIG 155.0 (H) 03/07/2021   HDL 59.20 03/07/2021   LDLCALC 183 (H) 03/07/2021   ALT 14 03/07/2021   AST 12 03/07/2021   NA 139 03/07/2021   K 4.2 03/07/2021   CL 104 03/07/2021   CREATININE 0.85 03/07/2021   BUN 20 03/07/2021   CO2 24 03/07/2021   TSH 2.89 03/07/2021   PSA 4.60 (H) 03/07/2021   INR 0.99 10/20/2016      Assessment & Plan:   Eric Oliver was seen today for annual exam and hypertension.  Diagnoses and all orders for this visit:  Encounter for general adult medical examination with abnormal findings- Exam completed, labs reviewed, vaccines reviewed and updated, cancer screenings addressed,  patient education was given.  Colon cancer screening -     Cologuard  Primary hypertension- Will check labs to screen for secondary causes and endorgan damage.  I recommended that he start taking an ARB. -     Basic metabolic panel; Future -     CBC with Differential/Platelet; Future -     TSH; Future -     Urinalysis, Routine  w reflex microscopic; Future -     Aldosterone + renin activity w/ ratio; Future -     EKG 12-Lead -     Aldosterone + renin activity w/ ratio -     Urinalysis, Routine w reflex microscopic -     TSH -     CBC with Differential/Platelet -     Basic metabolic panel -     olmesartan (BENICAR) 20 MG tablet; Take 1 tablet (20 mg total) by mouth daily.  Dyslipidemia, goal LDL below 130- I have asked him to take a statin for cardiovascular risk reduction. -     Lipid panel; Future -     TSH; Future -     Hepatic function panel; Future -     Hepatic function panel -     TSH -     Lipid panel -     rosuvastatin (CRESTOR) 20 MG tablet; Take 1 tablet (20 mg total) by mouth daily.  Benign prostatic hyperplasia without lower urinary tract symptoms- His PSA is mildly elevated but he is asymptomatic.  I have asked him to come back in 3 months to recheck his PSA. -     PSA; Future -     Urinalysis, Routine w reflex microscopic; Future -     Urinalysis, Routine w reflex microscopic -     PSA  Need for hepatitis C screening test -     Hepatitis C antibody; Future -     Hepatitis C antibody  Encounter for screening for HIV -     HIV Antibody (routine testing w rflx); Future -     HIV Antibody (routine testing w rflx)  Need for prophylactic vaccination with combined diphtheria-tetanus-pertussis (DTP) vaccine -     Tdap (BOOSTRIX) 5-2.5-18.5 LF-MCG/0.5 injection; Inject 0.5 mLs into the muscle once for 1 dose.  Need for vaccination -     Pneumococcal conjugate vaccine 20-valent (Prevnar 20)  Indirect left inguinal hernia -     Ambulatory referral to General  Surgery  PSA elevation- See above.   I am having Eric Oliver. Eric Oliver start on Boostrix, olmesartan, and rosuvastatin. I am also having him maintain his HYDROcodone-acetaminophen, gabapentin, Polyethyl Glycol-Propyl Glycol, naproxen sodium, aspirin EC, Magnesium, and Potassium.  Meds ordered this encounter  Medications   Tdap (BOOSTRIX) 5-2.5-18.5 LF-MCG/0.5 injection    Sig: Inject 0.5 mLs into the muscle once for 1 dose.    Dispense:  0.5 mL    Refill:  0   olmesartan (BENICAR) 20 MG tablet    Sig: Take 1 tablet (20 mg total) by mouth daily.    Dispense:  90 tablet    Refill:  0   rosuvastatin (CRESTOR) 20 MG tablet    Sig: Take 1 tablet (20 mg total) by mouth daily.    Dispense:  90 tablet    Refill:  1     Follow-up: Return in about 3 months (around 06/05/2021).  Scarlette Calico, MD

## 2021-03-11 ENCOUNTER — Encounter: Payer: Self-pay | Admitting: Internal Medicine

## 2021-03-14 LAB — HEPATITIS C ANTIBODY
Hepatitis C Ab: NONREACTIVE
SIGNAL TO CUT-OFF: 0.08 (ref <1.00)

## 2021-03-14 LAB — ALDOSTERONE + RENIN ACTIVITY W/ RATIO
ALDO / PRA Ratio: 2.6 Ratio (ref 0.9–28.9)
Aldosterone: 4 ng/dL
Renin Activity: 1.54 ng/mL/h (ref 0.25–5.82)

## 2021-03-14 LAB — HIV ANTIBODY (ROUTINE TESTING W REFLEX): HIV 1&2 Ab, 4th Generation: NONREACTIVE

## 2021-06-04 ENCOUNTER — Other Ambulatory Visit: Payer: Self-pay | Admitting: Internal Medicine

## 2021-06-04 DIAGNOSIS — I10 Essential (primary) hypertension: Secondary | ICD-10-CM

## 2021-06-08 ENCOUNTER — Ambulatory Visit (INDEPENDENT_AMBULATORY_CARE_PROVIDER_SITE_OTHER): Payer: PPO | Admitting: Internal Medicine

## 2021-06-08 ENCOUNTER — Encounter: Payer: Self-pay | Admitting: Internal Medicine

## 2021-06-08 VITALS — BP 144/78 | HR 76 | Temp 98.3°F | Resp 16 | Ht 68.0 in | Wt 190.0 lb

## 2021-06-08 DIAGNOSIS — Z1211 Encounter for screening for malignant neoplasm of colon: Secondary | ICD-10-CM

## 2021-06-08 DIAGNOSIS — I10 Essential (primary) hypertension: Secondary | ICD-10-CM | POA: Diagnosis not present

## 2021-06-08 DIAGNOSIS — N400 Enlarged prostate without lower urinary tract symptoms: Secondary | ICD-10-CM | POA: Diagnosis not present

## 2021-06-08 LAB — BASIC METABOLIC PANEL
BUN: 29 mg/dL — ABNORMAL HIGH (ref 6–23)
CO2: 25 mEq/L (ref 19–32)
Calcium: 9.5 mg/dL (ref 8.4–10.5)
Chloride: 105 mEq/L (ref 96–112)
Creatinine, Ser: 0.85 mg/dL (ref 0.40–1.50)
GFR: 87.95 mL/min (ref >60.00)
Glucose, Bld: 105 mg/dL — ABNORMAL HIGH (ref 70–99)
Potassium: 4.4 mEq/L (ref 3.5–5.1)
Sodium: 139 mEq/L (ref 135–145)

## 2021-06-08 LAB — PSA: PSA: 3.28 ng/mL (ref 0.10–4.00)

## 2021-06-08 MED ORDER — INDAPAMIDE 1.25 MG PO TABS: 1.2500 mg | ORAL_TABLET | Freq: Every day | ORAL | 1 refills | Status: DC

## 2021-06-08 NOTE — Patient Instructions (Signed)

## 2021-06-08 NOTE — Progress Notes (Signed)
? ?Subjective:  ?Patient ID: Eric Oliver, male    DOB: 11/26/1950  Age: 71 y.o. MRN: [id number] ? ?CC: Hypertension ? ? ?HPI ?Eric Oliver presents for f/up -  ? ?He is concerned that his blood pressure is not adequately well controlled.  He walks about 8 miles a day and does not experience chest pain, shortness of breath, diaphoresis, dizziness, lightheadedness, or edema.  He is concerned because he has a strong appetite and is not losing weight. ? ?Outpatient Medications Prior to Visit  ?Medication Sig Dispense Refill  ? aspirin EC 81 MG tablet Take 162 mg by mouth daily.    ? olmesartan (BENICAR) 20 MG tablet TAKE 1 TABLET BY MOUTH EVERY DAY 90 tablet 0  ? Polyethyl Glycol-Propyl Glycol 0.4-0.3 % SOLN Place 1 drop into both eyes 2 (two) times daily.    ? rosuvastatin (CRESTOR) 20 MG tablet Take 1 tablet (20 mg total) by mouth daily. 90 tablet 1  ? naproxen sodium (ANAPROX) 220 MG tablet Take 440-660 mg by mouth 2 (two) times daily as needed (for pain.).    ? Magnesium 250 MG TABS Take 500 mg by mouth daily.    ? Potassium 99 MG TABS Take 99 mg by mouth daily.    ? ?No facility-administered medications prior to visit.  ? ? ?ROS ?Review of Systems  ?Constitutional: Negative.  Negative for diaphoresis and fatigue.  ?HENT: Negative.    ?Eyes: Negative.   ?Respiratory:  Negative for cough, chest tightness, shortness of breath and wheezing.   ?Cardiovascular:  Negative for chest pain, palpitations and leg swelling.  ?Gastrointestinal:  Negative for abdominal pain, constipation, diarrhea, nausea and vomiting.  ?Endocrine: Negative.   ?Genitourinary:  Negative for difficulty urinating.  ?Musculoskeletal: Negative.  Negative for arthralgias and myalgias.  ?Skin: Negative.   ?Neurological:  Negative for weakness, light-headedness and headaches.  ?Hematological:  Negative for adenopathy. Does not bruise/bleed easily.  ?Psychiatric/Behavioral: Negative.    ? ?Objective:  ?BP (!) 144/78 (BP Location: Left Arm,  Patient Position: Sitting, Cuff Size: Large)   Pulse 76   Temp 98.3 ?F (36.8 ?C) (Oral)   Resp 16   Ht 5\' 8"  (1.727 m)   Wt 190 lb (86.2 kg)   SpO2 97%   BMI 28.89 kg/m?  ? ?BP Readings from Last 3 Encounters:  ?06/08/21 (!) 144/78  ?03/07/21 (!) 158/88  ?10/26/16 (!) 147/80  ? ? ?Wt Readings from Last 3 Encounters:  ?06/08/21 190 lb (86.2 kg)  ?03/07/21 189 lb (85.7 kg)  ?10/26/16 183 lb (83 kg)  ? ? ?Physical Exam ?Vitals reviewed.  ?HENT:  ?   Mouth/Throat:  ?   Mouth: Mucous membranes are moist.  ?Eyes:  ?   General: No scleral icterus. ?   Conjunctiva/sclera: Conjunctivae normal.  ?Cardiovascular:  ?   Rate and Rhythm: Normal rate and regular rhythm.  ?   Heart sounds: No murmur heard. ?Pulmonary:  ?   Effort: Pulmonary effort is normal.  ?   Breath sounds: No stridor. No wheezing, rhonchi or rales.  ?Abdominal:  ?   General: Abdomen is flat.  ?   Palpations: There is no mass.  ?   Tenderness: There is no abdominal tenderness. There is no guarding.  ?   Hernia: No hernia is present.  ?Musculoskeletal:     ?   General: Normal range of motion.  ?   Cervical back: Neck supple.  ?   Right lower leg: No edema.  ?  Left lower leg: No edema.  ?Lymphadenopathy:  ?   Cervical: No cervical adenopathy.  ?Skin: ?   General: Skin is warm and dry.  ?Neurological:  ?   General: No focal deficit present.  ?   Mental Status: He is alert. Mental status is at baseline.  ?Psychiatric:     ?   Mood and Affect: Mood normal.     ?   Behavior: Behavior normal.  ? ? ?Lab Results  ?Component Value Date  ? WBC 6.3 03/07/2021  ? HGB 15.3 03/07/2021  ? HCT 46.4 03/07/2021  ? PLT 309.0 03/07/2021  ? GLUCOSE 105 (H) 06/08/2021  ? CHOL 274 (H) 03/07/2021  ? TRIG 155.0 (H) 03/07/2021  ? HDL 59.20 03/07/2021  ? LDLCALC 183 (H) 03/07/2021  ? ALT 14 03/07/2021  ? AST 12 03/07/2021  ? NA 139 06/08/2021  ? K 4.4 06/08/2021  ? CL 105 06/08/2021  ? CREATININE 0.85 06/08/2021  ? BUN 29 (H) 06/08/2021  ? CO2 25 06/08/2021  ? TSH 2.89 03/07/2021   ? PSA 3.28 06/08/2021  ? INR 0.99 10/20/2016  ? ? ?DG Lumbar Spine 1 View ? ?Result Date: 10/26/2016 ?CLINICAL DATA:  Lumbar surgery. EXAM: LUMBAR SPINE - 1 VIEW COMPARISON:  MRI 10/01/2016 . FINDINGS: Lumbar spine numbered as per prior MRI. Metallic marker noted posteriorly at the L4-L5 disc space level. IMPRESSION: Metallic marker noted posteriorly at the L4-L5 disc space level. Electronically Signed   By: Maisie Fus  Register   On: 10/26/2016 15:50  ? ? ?Assessment & Plan:  ? ?Eric Oliver was seen today for hypertension. ? ?Diagnoses and all orders for this visit: ? ?Benign prostatic hyperplasia without lower urinary tract symptoms- His PSA is lower than it was previously.  This is a reassuring sign that he does not have prostate cancer. ?-     PSA; Future ?-     Discontinue: indapamide (LOZOL) 1.25 MG tablet; Take 1 tablet (1.25 mg total) by mouth daily. ?-     PSA ? ?Primary hypertension- He has not achieved his blood pressure goal of 130/80.  Will add indapamide to the ARB. ?-     Basic metabolic panel; Future ?-     Basic metabolic panel ?-     indapamide (LOZOL) 1.25 MG tablet; Take 1 tablet (1.25 mg total) by mouth daily. ? ?Colon cancer screening ?-     Ambulatory referral to Gastroenterology ? ? ?I have discontinued Eric Oliver's naproxen sodium, Magnesium, Potassium, and indapamide. I am also having him start on indapamide. Additionally, I am having him maintain his Polyethyl Glycol-Propyl Glycol, aspirin EC, rosuvastatin, and olmesartan. ? ?Meds ordered this encounter  ?Medications  ? DISCONTD: indapamide (LOZOL) 1.25 MG tablet  ?  Sig: Take 1 tablet (1.25 mg total) by mouth daily.  ?  Dispense:  90 tablet  ?  Refill:  1  ? indapamide (LOZOL) 1.25 MG tablet  ?  Sig: Take 1 tablet (1.25 mg total) by mouth daily.  ?  Dispense:  90 tablet  ?  Refill:  1  ? ? ? ?Follow-up: Return in about 6 months (around 12/08/2021). ? ?Sanda Linger, MD ?

## 2021-08-28 ENCOUNTER — Other Ambulatory Visit: Payer: Self-pay | Admitting: Internal Medicine

## 2021-08-28 DIAGNOSIS — I10 Essential (primary) hypertension: Secondary | ICD-10-CM

## 2021-08-30 ENCOUNTER — Other Ambulatory Visit: Payer: Self-pay | Admitting: Internal Medicine

## 2021-08-30 DIAGNOSIS — E785 Hyperlipidemia, unspecified: Secondary | ICD-10-CM

## 2021-11-26 ENCOUNTER — Other Ambulatory Visit: Payer: Self-pay | Admitting: Internal Medicine

## 2021-11-26 DIAGNOSIS — I10 Essential (primary) hypertension: Secondary | ICD-10-CM

## 2021-12-07 ENCOUNTER — Ambulatory Visit: Payer: PPO | Admitting: Internal Medicine

## 2021-12-14 ENCOUNTER — Encounter: Payer: Self-pay | Admitting: Nurse Practitioner

## 2021-12-14 ENCOUNTER — Ambulatory Visit (INDEPENDENT_AMBULATORY_CARE_PROVIDER_SITE_OTHER): Payer: PPO | Admitting: Nurse Practitioner

## 2021-12-14 VITALS — BP 144/66 | HR 84 | Temp 97.3°F | Resp 14 | Ht 68.0 in | Wt 192.4 lb

## 2021-12-14 DIAGNOSIS — Z9889 Other specified postprocedural states: Secondary | ICD-10-CM | POA: Diagnosis not present

## 2021-12-14 DIAGNOSIS — E7800 Pure hypercholesterolemia, unspecified: Secondary | ICD-10-CM

## 2021-12-14 DIAGNOSIS — K4090 Unilateral inguinal hernia, without obstruction or gangrene, not specified as recurrent: Secondary | ICD-10-CM | POA: Insufficient documentation

## 2021-12-14 DIAGNOSIS — N400 Enlarged prostate without lower urinary tract symptoms: Secondary | ICD-10-CM

## 2021-12-14 DIAGNOSIS — E663 Overweight: Secondary | ICD-10-CM | POA: Diagnosis not present

## 2021-12-14 DIAGNOSIS — E785 Hyperlipidemia, unspecified: Secondary | ICD-10-CM

## 2021-12-14 DIAGNOSIS — I10 Essential (primary) hypertension: Secondary | ICD-10-CM | POA: Diagnosis not present

## 2021-12-14 LAB — CBC
HCT: 39.6 % (ref 39.0–52.0)
Hemoglobin: 13.2 g/dL (ref 13.0–17.0)
MCHC: 33.3 g/dL (ref 30.0–36.0)
MCV: 89.2 fl (ref 78.0–100.0)
Platelets: 295 10*3/uL (ref 150.0–400.0)
RBC: 4.44 Mil/uL (ref 4.22–5.81)
RDW: 12 % (ref 11.5–15.5)
WBC: 5.7 10*3/uL (ref 4.0–10.5)

## 2021-12-14 LAB — COMPREHENSIVE METABOLIC PANEL
ALT: 25 U/L (ref 0–53)
AST: 18 U/L (ref 0–37)
Albumin: 4.6 g/dL (ref 3.5–5.2)
Alkaline Phosphatase: 49 U/L (ref 39–117)
BUN: 29 mg/dL — ABNORMAL HIGH (ref 6–23)
CO2: 28 mEq/L (ref 19–32)
Calcium: 9.3 mg/dL (ref 8.4–10.5)
Chloride: 104 mEq/L (ref 96–112)
Creatinine, Ser: 0.98 mg/dL (ref 0.40–1.50)
GFR: 77.76 mL/min (ref >60.00)
Glucose, Bld: 99 mg/dL (ref 70–99)
Potassium: 4.3 mEq/L (ref 3.5–5.1)
Sodium: 138 mEq/L (ref 135–145)
Total Bilirubin: 0.4 mg/dL (ref 0.2–1.2)
Total Protein: 7.1 g/dL (ref 6.0–8.3)

## 2021-12-14 LAB — LIPID PANEL
Cholesterol: 145 mg/dL (ref 0–200)
HDL: 61.8 mg/dL (ref >39.00)
LDL Cholesterol: 70 mg/dL (ref 0–99)
NonHDL: 83.58
Total CHOL/HDL Ratio: 2
Triglycerides: 70 mg/dL (ref 0.0–149.0)
VLDL: 14 mg/dL (ref 0.0–40.0)

## 2021-12-14 LAB — TSH: TSH: 2.02 u[IU]/mL (ref 0.35–5.50)

## 2021-12-14 MED ORDER — AMLODIPINE BESYLATE 5 MG PO TABS: 5.0000 mg | ORAL_TABLET | Freq: Every day | ORAL | 1 refills | Status: DC

## 2021-12-14 NOTE — Patient Instructions (Signed)
Nice to see you  We are going to STOP the omlesartan and START amlodipine. Follow up with me in 1 month, sooner if you need me  I will be in touch with the labs once I have them  Red yeast rice and niacin help with cholesterol  Saw palmetto can help with prostate

## 2021-12-14 NOTE — Assessment & Plan Note (Signed)
Present on exam no red flag symptoms in office.  Patient has been evaluated by surgery already.  Watchful waiting at this point.  Did review signs and symptoms of incarceration and when to seek emergency help

## 2021-12-14 NOTE — Assessment & Plan Note (Signed)
Patient currently maintained onlesartan and lozol.  Patient is having adverse drug event on  omlesartan.  We will discontinue ARB medication start patient ON calcium channel blocker amlodipine 5 mg.  Did discuss common side effects of medication inclusive of constipation lower extremity edema.  We will have patient follow-up in 1 month to see blood pressure control we can continue the indapamide as is for now.

## 2021-12-14 NOTE — Assessment & Plan Note (Signed)
Stable PSA on recheck within normal limits.

## 2021-12-14 NOTE — Assessment & Plan Note (Signed)
Patient's last LDL was 183.  Patient currently maintained on rosuvastatin 20 mg.  Pending labs today.  Did discuss possibility of using red yeast rice and niacin and agents to help lower patient's cholesterol and a more supplemental/homeopathic avenue.

## 2021-12-14 NOTE — Progress Notes (Signed)
Established Patient Office Visit  Subjective   Patient ID: Eric Oliver, male    DOB: [birth year]  Age: 71 y.o. MRN: [id number]  Chief Complaint  Patient presents with   Transfer of Care    Previous PCP Dr Ronnald Ramp   Medication Management    Discuss risk vs benefits, would like to do more of a natural approach to controlling his health    HPI  Patient presents for transfer of care.  He is present with his wife at bedside.  HTN: does not check and cannot check it at home. States that since starting the ARB he has had trouble with erectile dysfunction.  He was restarted on this medication at the beginning of the year.  He is also on Lozol  BPH: Historical diagnosis.  No overt nocturia that patient is not used to/his baseline.  History of elevated PSA but upon recheck within normal limits.  Dyslipidemia: Patient currently maintained on rosuvastatin 20 mg.  Per his report tolerates medication well.  He has had and is interested in more natural homeopathic modalities for cholesterol control.  Patient does play golf quite frequently  Inguinal hernia: Was diagnosed by previous PCP patient has been seen at [facility] surgery.  They decided to hold off on correcting hernia surgically at this juncture.  Patient has no acute complaints today in regards to hernia    Review of Systems  Constitutional:  Negative for chills and fever.  Respiratory:  Negative for shortness of breath.   Cardiovascular:  Negative for chest pain and leg swelling.  Gastrointestinal:  Negative for abdominal pain, blood in stool, constipation, diarrhea, nausea and vomiting.       BM every other day   Genitourinary:  Negative for dysuria and hematuria.       Nocturia 1-2  Neurological:  Negative for dizziness, tingling and headaches.  Psychiatric/Behavioral:  Negative for hallucinations and suicidal ideas.       Objective:     BP (!) 144/66   Pulse 84   Temp (!) 97.3 F (36.3 C) (Temporal)   Resp 14    Ht 5\' 8"  (1.727 m)   Wt 192 lb 6 oz (87.3 kg)   SpO2 96%   BMI 29.25 kg/m    Physical Exam Vitals and nursing note reviewed. Exam conducted with a chaperone present Beatriz Stallion, CMA).  Constitutional:      Appearance: Normal appearance.  HENT:     Right Ear: Tympanic membrane, ear canal and external ear normal.     Left Ear: Tympanic membrane, ear canal and external ear normal.     Mouth/Throat:     Mouth: Mucous membranes are moist.     Pharynx: Oropharynx is clear.  Eyes:     Extraocular Movements: Extraocular movements intact.     Pupils: Pupils are equal, round, and reactive to light.  Cardiovascular:     Rate and Rhythm: Normal rate and regular rhythm.     Pulses: Normal pulses.     Heart sounds: Normal heart sounds.  Pulmonary:     Effort: Pulmonary effort is normal.     Breath sounds: Normal breath sounds.  Abdominal:     General: Bowel sounds are normal. There is no distension.     Palpations: There is no mass.     Tenderness: There is no abdominal tenderness.     Hernia: A hernia is present. Hernia is present in the umbilical area.  Genitourinary:   Musculoskeletal:  Right lower leg: No edema.     Left lower leg: No edema.  Lymphadenopathy:     Cervical: No cervical adenopathy.  Skin:    General: Skin is warm.  Neurological:     General: No focal deficit present.     Mental Status: He is alert.     Deep Tendon Reflexes:     Reflex Scores:      Bicep reflexes are 2+ on the right side and 2+ on the left side.      Patellar reflexes are 2+ on the right side and 2+ on the left side.    Comments: Bilateral upper and lower extremity strength 5/5  Psychiatric:        Mood and Affect: Mood normal.        Behavior: Behavior normal.        Thought Content: Thought content normal.        Judgment: Judgment normal.      No results found for any visits on 12/14/21.    The 10-year ASCVD risk score (Arnett DK, et al., 2019) is: 28.6%     Assessment & Plan:   Problem List Items Addressed This Visit       Cardiovascular and Mediastinum   Primary hypertension    Patient currently maintained onlesartan and lozol.  Patient is having adverse drug event on  omlesartan.  We will discontinue ARB medication start patient ON calcium channel blocker amlodipine 5 mg.  Did discuss common side effects of medication inclusive of constipation lower extremity edema.  We will have patient follow-up in 1 month to see blood pressure control we can continue the indapamide as is for now.      Relevant Medications   amLODipine (NORVASC) 5 MG tablet   Other Relevant Orders   CBC   Comprehensive metabolic panel   TSH     Genitourinary   Benign prostatic hyperplasia without lower urinary tract symptoms - Primary    Stable PSA on recheck within normal limits.        Other   S/P lumbar laminectomy   Dyslipidemia, goal LDL below 130    Patient's last LDL was 183.  Patient currently maintained on rosuvastatin 20 mg.  Pending labs today.  Did discuss possibility of using red yeast rice and niacin and agents to help lower patient's cholesterol and a more supplemental/homeopathic avenue.      Relevant Medications   amLODipine (NORVASC) 5 MG tablet   Inguinal hernia    Present on exam no red flag symptoms in office.  Patient has been evaluated by surgery already.  Watchful waiting at this point.  Did review signs and symptoms of incarceration and when to seek emergency help      Pure hypercholesterolemia   Relevant Medications   amLODipine (NORVASC) 5 MG tablet   Other Relevant Orders   Lipid panel   Other Visit Diagnoses     Overweight       Relevant Orders   Lipid panel       Return in about 4 weeks (around 01/11/2022) for BP recheck .    Audria Nine, NP

## 2021-12-15 ENCOUNTER — Encounter: Payer: Self-pay | Admitting: Nurse Practitioner

## 2021-12-19 ENCOUNTER — Telehealth: Payer: Self-pay | Admitting: Nurse Practitioner

## 2021-12-19 NOTE — Telephone Encounter (Signed)
LVM for pt to rtn my call to schedule AWV with [facility] call back # [phone] 

## 2021-12-22 NOTE — Telephone Encounter (Signed)
Have you heard of this. Refer to [employer]

## 2021-12-22 NOTE — Telephone Encounter (Signed)
There have been some studies that seem to suggest this relationship, but obviously no RCTs, it is all observational. The data suggests that higher potency and higher dose statins may carry a higher risk of developing DM in patients that have prediabetes already- this relationship does not seem to exist in patients without prediabetes.  Basically, since he does not have prediabetes, his risk for developing diabetes is the same whether he takes rosuvastatin or not. But if he wants to use a different statin, low or moderate intensity statins (pravastatin would be a good option) would be reasonable.  [facility] has a good summary of all of this: https://www.ccjm.org/content/90/1/53#:~:text=The%20data%20generally%20suggested%20a,rosuvastatin%20and%20lowest%20with%20pravastatin.

## 2022-01-01 ENCOUNTER — Other Ambulatory Visit: Payer: Self-pay | Admitting: Internal Medicine

## 2022-01-01 DIAGNOSIS — I10 Essential (primary) hypertension: Secondary | ICD-10-CM

## 2022-01-13 ENCOUNTER — Ambulatory Visit (INDEPENDENT_AMBULATORY_CARE_PROVIDER_SITE_OTHER): Payer: PPO | Admitting: Nurse Practitioner

## 2022-01-13 ENCOUNTER — Encounter: Payer: Self-pay | Admitting: Nurse Practitioner

## 2022-01-13 VITALS — BP 146/70 | HR 76 | Temp 96.7°F | Resp 12 | Ht 68.0 in | Wt 196.0 lb

## 2022-01-13 DIAGNOSIS — I10 Essential (primary) hypertension: Secondary | ICD-10-CM | POA: Diagnosis not present

## 2022-01-13 MED ORDER — LOSARTAN POTASSIUM 25 MG PO TABS: 25.0000 mg | ORAL_TABLET | Freq: Every day | ORAL | 1 refills | Status: DC

## 2022-01-13 NOTE — Assessment & Plan Note (Signed)
Patient was on omlesartan and experienced erectile dysfunction and discontinue medication.  He is on indapamide.  Patient was placed on amlodipine 5 mg but had lots of lower extremity swelling and discontinue medication.  We will try patient on low dose losartan follow-up 1 month.

## 2022-01-13 NOTE — Progress Notes (Signed)
   Established Patient Office Visit  Subjective   Patient ID: Eric Oliver, male    DOB: [birth year]  Age: 71 y.o. MRN: [id number]  Chief Complaint  Patient presents with   Hypertension    Follow up-stopped taking Amlodipine this week due to swelling in his ankles.      HTN: States that he stopped taking the amlodipine on Tuesday. States that both ankles were swelling on him at that time. States that he was able to check it at home once. States that it was 140/66.  Was on omlesartan in the past but this did cause erectile dysfunction.  Patient discontinue that medication. Patient still currently taking indapamide diuretic  Patient did mention that he does have a hernia surgery scheduled for February 17, 2022 with Dr. Sheliah Hatch.  Did ask the patient and patient's spouse if he needs medical clearance they were unsure.  States they gave him a paper talking about clearance but they have not scheduled that yet likely through the anesthesia group.  They will reach out to the surgery group to make sure    Review of Systems  Constitutional:  Negative for chills and fever.  Eyes:  Positive for blurred vision. Negative for double vision.  Respiratory:  Negative for shortness of breath.   Cardiovascular:  Positive for leg swelling. Negative for chest pain.  Neurological:  Negative for dizziness and headaches.      Objective:     BP (!) 146/70   Pulse 76   Temp (!) 96.7 F (35.9 C)   Resp 12   Ht 5\' 8"  (1.727 m)   Wt 196 lb (88.9 kg)   SpO2 94%   BMI 29.80 kg/m    Physical Exam Vitals and nursing note reviewed.  Constitutional:      Appearance: Normal appearance.  Cardiovascular:     Rate and Rhythm: Normal rate and regular rhythm.     Heart sounds: Normal heart sounds.  Pulmonary:     Breath sounds: Normal breath sounds.  Musculoskeletal:     Right lower leg: No edema.     Left lower leg: No edema.  Neurological:     Mental Status: He is alert.      No results  found for any visits on 01/13/22.    The 10-year ASCVD risk score (Arnett DK, et al., 2019) is: 21.9%    Assessment & Plan:   Problem List Items Addressed This Visit       Cardiovascular and Mediastinum   Primary hypertension - Primary    Patient was on omlesartan and experienced erectile dysfunction and discontinue medication.  He is on indapamide.  Patient was placed on amlodipine 5 mg but had lots of lower extremity swelling and discontinue medication.  We will try patient on low dose losartan follow-up 1 month.      Relevant Medications   losartan (COZAAR) 25 MG tablet    Return in about 4 weeks (around 02/10/2022).    02/12/2022, NP

## 2022-01-13 NOTE — Patient Instructions (Addendum)
Nice to see you today I want to see you in 1 month for a recheck Check BP at home for me and write them down   Call the surgeons office and make sure you do not need medical clearance from PCP for surgery

## 2022-02-16 ENCOUNTER — Other Ambulatory Visit: Payer: Self-pay | Admitting: Nurse Practitioner

## 2022-02-16 DIAGNOSIS — I10 Essential (primary) hypertension: Secondary | ICD-10-CM

## 2022-02-21 ENCOUNTER — Encounter: Payer: Self-pay | Admitting: Nurse Practitioner

## 2022-02-24 ENCOUNTER — Ambulatory Visit (INDEPENDENT_AMBULATORY_CARE_PROVIDER_SITE_OTHER): Payer: PPO | Admitting: Nurse Practitioner

## 2022-02-24 ENCOUNTER — Encounter: Payer: Self-pay | Admitting: Nurse Practitioner

## 2022-02-24 VITALS — BP 134/60 | HR 88 | Temp 98.2°F | Resp 16 | Ht 68.0 in | Wt 188.5 lb

## 2022-02-24 DIAGNOSIS — Z9889 Other specified postprocedural states: Secondary | ICD-10-CM | POA: Diagnosis not present

## 2022-02-24 DIAGNOSIS — I10 Essential (primary) hypertension: Secondary | ICD-10-CM | POA: Diagnosis not present

## 2022-02-24 DIAGNOSIS — Z8719 Personal history of other diseases of the digestive system: Secondary | ICD-10-CM

## 2022-02-24 LAB — BASIC METABOLIC PANEL
BUN: 24 mg/dL — ABNORMAL HIGH (ref 6–23)
CO2: 31 mEq/L (ref 19–32)
Calcium: 10.6 mg/dL — ABNORMAL HIGH (ref 8.4–10.5)
Chloride: 97 mEq/L (ref 96–112)
Creatinine, Ser: 0.96 mg/dL (ref 0.40–1.50)
GFR: 79.6 mL/min (ref >60.00)
Glucose, Bld: 130 mg/dL — ABNORMAL HIGH (ref 70–99)
Potassium: 3.3 mEq/L — ABNORMAL LOW (ref 3.5–5.1)
Sodium: 138 mEq/L (ref 135–145)

## 2022-02-24 MED ORDER — LOSARTAN POTASSIUM 25 MG PO TABS: 25.0000 mg | ORAL_TABLET | Freq: Every day | ORAL | 1 refills | Status: DC

## 2022-02-24 NOTE — Assessment & Plan Note (Signed)
Patient currently maintained on losartan 25 mg and indapamide 1.25.  Recheck BMP today.  Patient and spouse mentioned anesthesiology was concerned that the patient's potassium was "really low" we will have that rechecked in labs today also

## 2022-02-24 NOTE — Patient Instructions (Signed)
Nice to see you today I will be in touch with the labs once I have reviewed them Follow up with the surgeon as schedule I want to see you in 6 months for you physical, sooner if you need me

## 2022-02-24 NOTE — Progress Notes (Signed)
   Established Patient Office Visit  Subjective   Patient ID: Eric Oliver, male    DOB: 1950-09-24  Age: 71 y.o. MRN: [id number]  Chief Complaint  Patient presents with   Hypertension       HTN: states that he can check blood pressure at home but has not. States that he is tolerating the medication well.  Patient has been omlesartan in the past but experienced adverse drug event of erectile dysfunction.  He was discontinued.  Hernia: Repair of umbilical and inginal repair, on the left side. Has a follow up on 03/16/2022 with general surgery.  Patient seems to be doing well in office.  Patient is concerned as he states the inguinal incision site is still bulged out like the hernia was before.    Review of Systems  Constitutional:  Negative for chills and fever.  Respiratory:  Negative for shortness of breath.   Cardiovascular:  Negative for chest pain.  Gastrointestinal:  Positive for constipation. Negative for abdominal pain, diarrhea, nausea and vomiting.      Objective:     BP 134/60   Pulse 88   Temp 98.2 F (36.8 C)   Resp 16   Ht 5\' 8"  (1.727 m)   Wt 188 lb 8 oz (85.5 kg)   SpO2 95%   BMI 28.66 kg/m    Physical Exam Vitals and nursing note reviewed.  Constitutional:      Appearance: Normal appearance.  Cardiovascular:     Rate and Rhythm: Normal rate and regular rhythm.     Heart sounds: Normal heart sounds.  Pulmonary:     Effort: Pulmonary effort is normal.     Breath sounds: Normal breath sounds.  Abdominal:     General: Bowel sounds are normal.     Palpations: There is no mass.     Tenderness: There is abdominal tenderness.     Hernia: No hernia is present.    Genitourinary:   Neurological:     Mental Status: He is alert.      No results found for any visits on 02/24/22.    The 10-year ASCVD risk score (Arnett DK, et al., 2019) is: 19.1%    Assessment & Plan:   Problem List Items Addressed This Visit       Cardiovascular  and Mediastinum   Primary hypertension    Patient currently maintained on losartan 25 mg and indapamide 1.25.  Recheck BMP today.  Patient and spouse mentioned anesthesiology was concerned that the patient's potassium was "really low" we will have that rechecked in labs today also      Relevant Medications   losartan (COZAAR) 25 MG tablet   Other Relevant Orders   Basic metabolic panel     Other   S/P inguinal hernia repair - Primary    Umbilicus and inguinal hernia incisions healing well.  Did assure patient that the "bulging" should reduce over time as is likely swelling and inflammation due to having a recent surgery 1 week ago.  Encourage patient to follow-up with surgeon as recommended       Return in about 6 months (around 08/26/2022) for CPE and Labs.    08/28/2022, NP

## 2022-02-24 NOTE — Assessment & Plan Note (Signed)
Umbilicus and inguinal hernia incisions healing well.  Did assure patient that the "bulging" should reduce over time as is likely swelling and inflammation due to having a recent surgery 1 week ago.  Encourage patient to follow-up with surgeon as recommended

## 2022-03-01 ENCOUNTER — Other Ambulatory Visit: Payer: Self-pay | Admitting: Internal Medicine

## 2022-03-01 DIAGNOSIS — E785 Hyperlipidemia, unspecified: Secondary | ICD-10-CM

## 2022-03-15 ENCOUNTER — Other Ambulatory Visit: Payer: Self-pay | Admitting: Internal Medicine

## 2022-03-15 DIAGNOSIS — E785 Hyperlipidemia, unspecified: Secondary | ICD-10-CM

## 2022-03-17 ENCOUNTER — Other Ambulatory Visit: Payer: Self-pay | Admitting: Internal Medicine

## 2022-03-17 DIAGNOSIS — E785 Hyperlipidemia, unspecified: Secondary | ICD-10-CM

## 2022-03-17 LAB — HM DIABETES EYE EXAM

## 2022-03-20 ENCOUNTER — Encounter: Payer: Self-pay | Admitting: Nurse Practitioner

## 2022-03-20 DIAGNOSIS — E785 Hyperlipidemia, unspecified: Secondary | ICD-10-CM

## 2022-03-20 MED ORDER — ROSUVASTATIN CALCIUM 20 MG PO TABS: 20.0000 mg | ORAL_TABLET | Freq: Every day | ORAL | 1 refills | Status: DC

## 2022-05-11 ENCOUNTER — Other Ambulatory Visit: Payer: Self-pay | Admitting: Internal Medicine

## 2022-05-11 DIAGNOSIS — I10 Essential (primary) hypertension: Secondary | ICD-10-CM

## 2022-05-23 ENCOUNTER — Encounter: Payer: Self-pay | Admitting: Nurse Practitioner

## 2022-05-24 ENCOUNTER — Ambulatory Visit (INDEPENDENT_AMBULATORY_CARE_PROVIDER_SITE_OTHER): Payer: PPO | Admitting: Nurse Practitioner

## 2022-05-24 ENCOUNTER — Encounter: Payer: Self-pay | Admitting: Nurse Practitioner

## 2022-05-24 VITALS — BP 128/72 | HR 78 | Temp 99.2°F | Resp 16 | Ht 68.0 in | Wt 197.4 lb

## 2022-05-24 DIAGNOSIS — Z01818 Encounter for other preprocedural examination: Secondary | ICD-10-CM

## 2022-05-24 DIAGNOSIS — R7309 Other abnormal glucose: Secondary | ICD-10-CM | POA: Diagnosis not present

## 2022-05-24 DIAGNOSIS — M25511 Pain in right shoulder: Secondary | ICD-10-CM | POA: Diagnosis not present

## 2022-05-24 LAB — BASIC METABOLIC PANEL
BUN: 19 mg/dL (ref 6–23)
CO2: 27 mEq/L (ref 19–32)
Calcium: 9.4 mg/dL (ref 8.4–10.5)
Chloride: 104 mEq/L (ref 96–112)
Creatinine, Ser: 0.76 mg/dL (ref 0.40–1.50)
GFR: 90.36 mL/min (ref >60.00)
Glucose, Bld: 151 mg/dL — ABNORMAL HIGH (ref 70–99)
Potassium: 3.5 mEq/L (ref 3.5–5.1)
Sodium: 137 mEq/L (ref 135–145)

## 2022-05-24 LAB — CBC
HCT: 42.8 % (ref 39.0–52.0)
Hemoglobin: 14.4 g/dL (ref 13.0–17.0)
MCHC: 33.6 g/dL (ref 30.0–36.0)
MCV: 87.5 fl (ref 78.0–100.0)
Platelets: 324 10*3/uL (ref 150.0–400.0)
RBC: 4.89 Mil/uL (ref 4.22–5.81)
RDW: 13.3 % (ref 11.5–15.5)
WBC: 5.9 10*3/uL (ref 4.0–10.5)

## 2022-05-24 MED ORDER — GABAPENTIN 300 MG PO CAPS: 300.0000 mg | ORAL_CAPSULE | Freq: Every evening | ORAL | 1 refills | Status: AC

## 2022-05-24 NOTE — Progress Notes (Signed)
Acute Office Visit  Subjective:     Patient ID: Eric Oliver, male    DOB: [birth year], 72 y.o.   MRN: [id number]  Chief Complaint  Patient presents with   Pre-op Exam    HPI Patient is in today for pre-operative clearance with a history of  HTN, BPH, and HLD  Patient is followed by Mariea Stable, MD for a right total reverse shoulder replacement.  Patient is here today for preoperative clearance  No trouble with anesthesia. No personal or family history of milignant hyperthermia..  Patient denies any trouble with post op nausea vomiting in regards to anesthesia.  Patient recently underwent a inguinal hernia repair without incident.  Patient does take medications for blood pressure and cholesterol.  Patient is not followed by cardiology.  He has asked if he could get a scropt for gabapentin. States that he was using them for his back.  States that he has taken 2 at night and it helps the arm pain and discomfort    Review of Systems  Constitutional:  Negative for chills and fever.  Respiratory:  Negative for shortness of breath.   Cardiovascular:  Negative for chest pain.  Neurological:  Negative for headaches.        Objective:    BP 128/72   Pulse 78   Temp 99.2 F (37.3 C)   Resp 16   Ht 5\' 8"  (1.727 m)   Wt 197 lb 6 oz (89.5 kg)   SpO2 98%   BMI 30.01 kg/m    Physical Exam Vitals and nursing note reviewed.  Constitutional:      Appearance: Normal appearance.  Cardiovascular:     Rate and Rhythm: Normal rate and regular rhythm.     Heart sounds: Normal heart sounds.  Pulmonary:     Effort: Pulmonary effort is normal.     Breath sounds: Normal breath sounds.  Neurological:     Mental Status: He is alert.     No results found for any visits on 05/24/22.      Assessment & Plan:   Problem List Items Addressed This Visit       Other   Encounter for preoperative examination for general surgical procedure - Primary    Pending blood work we  will clear patient for orthopedic procedure.  Patient does have forms that were faxed office will fill them out and fax once lab work has been reviewed.      Relevant Orders   EKG 12-Lead (Completed)   CBC   Basic metabolic panel   Right shoulder pain    Patient is being followed by orthopedics and is scheduled to undergo right reverse total shoulder arthroplasty.  States he had a reported gabapentin that is out of date but use it with his arm and his help with the discomfort he requested a refill of that today.  Refill provided.  He can take gabapentin 300 mg 1 capsule nightly      Relevant Medications   gabapentin (NEURONTIN) 300 MG capsule    Meds ordered this encounter  Medications   gabapentin (NEURONTIN) 300 MG capsule    Sig: Take 1 capsule (300 mg total) by mouth at bedtime.    Dispense:  30 capsule    Refill:  1    Order Specific Question:   Supervising Provider    Answer:   TOWER, MARNE A [[id number]    Return if symptoms worsen or fail to improve, for As scheduled .  Matt  Charmian Muff, NP

## 2022-05-24 NOTE — Patient Instructions (Signed)
Nice to see you  As long as the labs look good you will be cleared for surgery Follow up with me as scheduled for your physical, sooner if you need me

## 2022-05-24 NOTE — Assessment & Plan Note (Addendum)
Patient is being followed by orthopedics and is scheduled to undergo right reverse total shoulder arthroplasty.  States he had a reported gabapentin that is out of date but use it with his arm and his help with the discomfort he requested a refill of that today.  Refill provided.  He can take gabapentin 300 mg 1 capsule nightly

## 2022-05-24 NOTE — Assessment & Plan Note (Signed)
Pending blood work we will clear patient for orthopedic procedure.  Patient does have forms that were faxed office will fill them out and fax once lab work has been reviewed.

## 2022-05-25 ENCOUNTER — Encounter: Payer: Self-pay | Admitting: Nurse Practitioner

## 2022-05-25 ENCOUNTER — Other Ambulatory Visit (INDEPENDENT_AMBULATORY_CARE_PROVIDER_SITE_OTHER): Payer: PPO

## 2022-05-25 DIAGNOSIS — R7309 Other abnormal glucose: Secondary | ICD-10-CM

## 2022-05-25 LAB — HEMOGLOBIN A1C: Hgb A1c MFr Bld: 6.7 % — ABNORMAL HIGH (ref 4.6–6.5)

## 2022-05-25 NOTE — Addendum Note (Signed)
Addended by: Ellamae Sia on: 05/25/2022 10:33 AM   Modules accepted: Orders

## 2022-07-06 ENCOUNTER — Other Ambulatory Visit: Payer: Self-pay | Admitting: Nurse Practitioner

## 2022-07-06 DIAGNOSIS — I10 Essential (primary) hypertension: Secondary | ICD-10-CM

## 2022-07-14 ENCOUNTER — Telehealth: Payer: Self-pay | Admitting: Nurse Practitioner

## 2022-07-14 NOTE — Telephone Encounter (Signed)
Contacted Eric Oliver to schedule their annual wellness visit. Patient declined to schedule AWV at this time. Will call back to schedule.  [facility] AWV TEAM Direct Dial: [phone]

## 2022-08-31 ENCOUNTER — Other Ambulatory Visit: Payer: Self-pay | Admitting: Nurse Practitioner

## 2022-08-31 DIAGNOSIS — I10 Essential (primary) hypertension: Secondary | ICD-10-CM

## 2022-09-01 ENCOUNTER — Encounter: Payer: PPO | Admitting: Nurse Practitioner

## 2022-09-01 NOTE — Telephone Encounter (Signed)
Medication: Losartan 25 mg Directions: take 1 tablet po daily Last given: 02/24/22 Number refills: 1 Last o/v: 05/24/22 Follow up: 11/01/22 Labs: 05/24/22

## 2022-09-06 ENCOUNTER — Telehealth: Payer: Self-pay

## 2022-09-06 NOTE — Telephone Encounter (Signed)
Received clearance form from [facility]. Contacted patient to set up visit for evaluation and labs requested in form. Patient declined appointment states " He had no plan on having surgery" did not need or want appointment or form filled out. Will reach out if he changes his mind. Form has been sent to shred not needed.  No further action needed at this time.

## 2022-10-12 ENCOUNTER — Encounter (INDEPENDENT_AMBULATORY_CARE_PROVIDER_SITE_OTHER): Payer: Self-pay

## 2022-10-15 ENCOUNTER — Other Ambulatory Visit: Payer: Self-pay | Admitting: Nurse Practitioner

## 2022-10-15 DIAGNOSIS — I10 Essential (primary) hypertension: Secondary | ICD-10-CM

## 2022-11-01 ENCOUNTER — Ambulatory Visit (INDEPENDENT_AMBULATORY_CARE_PROVIDER_SITE_OTHER): Payer: PPO | Admitting: Nurse Practitioner

## 2022-11-01 ENCOUNTER — Encounter: Payer: Self-pay | Admitting: Nurse Practitioner

## 2022-11-01 VITALS — BP 140/76 | HR 73 | Temp 98.3°F | Ht 67.25 in | Wt 181.6 lb

## 2022-11-01 DIAGNOSIS — E785 Hyperlipidemia, unspecified: Secondary | ICD-10-CM

## 2022-11-01 DIAGNOSIS — Z125 Encounter for screening for malignant neoplasm of prostate: Secondary | ICD-10-CM

## 2022-11-01 DIAGNOSIS — Z1211 Encounter for screening for malignant neoplasm of colon: Secondary | ICD-10-CM

## 2022-11-01 DIAGNOSIS — E119 Type 2 diabetes mellitus without complications: Secondary | ICD-10-CM | POA: Diagnosis not present

## 2022-11-01 DIAGNOSIS — N400 Enlarged prostate without lower urinary tract symptoms: Secondary | ICD-10-CM

## 2022-11-01 DIAGNOSIS — Z0000 Encounter for general adult medical examination without abnormal findings: Secondary | ICD-10-CM | POA: Diagnosis not present

## 2022-11-01 DIAGNOSIS — I10 Essential (primary) hypertension: Secondary | ICD-10-CM

## 2022-11-01 DIAGNOSIS — M25511 Pain in right shoulder: Secondary | ICD-10-CM

## 2022-11-01 LAB — COMPREHENSIVE METABOLIC PANEL
ALT: 15 U/L (ref 0–53)
AST: 15 U/L (ref 0–37)
Albumin: 4.3 g/dL (ref 3.5–5.2)
Alkaline Phosphatase: 55 U/L (ref 39–117)
BUN: 18 mg/dL (ref 6–23)
CO2: 30 meq/L (ref 19–32)
Calcium: 9.6 mg/dL (ref 8.4–10.5)
Chloride: 99 meq/L (ref 96–112)
Creatinine, Ser: 0.8 mg/dL (ref 0.40–1.50)
GFR: 88.7 mL/min (ref >60.00)
Glucose, Bld: 113 mg/dL — ABNORMAL HIGH (ref 70–99)
Potassium: 3.4 meq/L — ABNORMAL LOW (ref 3.5–5.1)
Sodium: 137 meq/L (ref 135–145)
Total Bilirubin: 0.6 mg/dL (ref 0.2–1.2)
Total Protein: 7.2 g/dL (ref 6.0–8.3)

## 2022-11-01 LAB — CBC
HCT: 44.4 % (ref 39.0–52.0)
Hemoglobin: 14.7 g/dL (ref 13.0–17.0)
MCHC: 33.2 g/dL (ref 30.0–36.0)
MCV: 88.4 fl (ref 78.0–100.0)
Platelets: 335 10*3/uL (ref 150.0–400.0)
RBC: 5.02 Mil/uL (ref 4.22–5.81)
RDW: 12.7 % (ref 11.5–15.5)
WBC: 3.9 10*3/uL — ABNORMAL LOW (ref 4.0–10.5)

## 2022-11-01 LAB — PSA, MEDICARE: PSA: 8.36 ng/mL — ABNORMAL HIGH (ref 0.10–4.00)

## 2022-11-01 LAB — MICROALBUMIN / CREATININE URINE RATIO
Creatinine,U: 71.2 mg/dL
Microalb Creat Ratio: 1.3 mg/g (ref 0.0–30.0)
Microalb, Ur: 0.9 mg/dL (ref 0.0–1.9)

## 2022-11-01 LAB — LIPID PANEL
Cholesterol: 254 mg/dL — ABNORMAL HIGH (ref 0–200)
HDL: 61.6 mg/dL (ref >39.00)
LDL Cholesterol: 179 mg/dL — ABNORMAL HIGH (ref 0–99)
NonHDL: 192.74
Total CHOL/HDL Ratio: 4
Triglycerides: 70 mg/dL (ref 0.0–149.0)
VLDL: 14 mg/dL (ref 0.0–40.0)

## 2022-11-01 LAB — POCT GLYCOSYLATED HEMOGLOBIN (HGB A1C): Hemoglobin A1C: 6.2 % — AB (ref 4.0–5.6)

## 2022-11-01 LAB — TSH: TSH: 2.51 u[IU]/mL (ref 0.35–5.50)

## 2022-11-01 NOTE — Progress Notes (Signed)
Established Patient Office Visit  Subjective   Patient ID: Eric Oliver, male    DOB: 18-Nov-1950  Age: 72 y.o. MRN: [id number]  Chief Complaint  Patient presents with   Annual Exam    HPI  DM2: At last office it patient's A1c was 6.7% classifying him as diabetic.  Given patient's age he was well-controlled on diet.  Patient has not been checking glucose at home.  But he has been working on his diet in regards to limiting sugar  HTN: states that he has not been checking blood pressure at home.  He is tolerating the losartan and indapamide well  HLD: Patient is currently maintained on rosuvastatin 20 mg daily and tolerates it well.  for complete physical and follow up of chronic conditions.  Immunizations: -Tetanus: Completed in 2006, update at local pharmacy -Influenza: refused  -Shingles:  refused  -Pneumonia:  utd  Diet: Fair diet. States that he is doing 2 meals a day. States some snakcs. States that he is drinking water Exercise: No regular exercise. Golf twice a week and hits balls 3 times a week. States that he will walk the ourse a lot  Eye exam: seen in [place]  Dental exam: Completes semi-annually    Colonoscopy:   needs referral for [facility] [facility] Screening: N/A  PSA: Due  Sleep: state sthat he goes to bed around 10 and will get up around 630. Feels rested. Intermittent snoring       Review of Systems  Constitutional:  Negative for chills and fever.  Respiratory:  Negative for shortness of breath.   Cardiovascular:  Negative for chest pain and leg swelling.  Gastrointestinal:  Negative for abdominal pain, blood in stool, constipation, diarrhea, nausea and vomiting.       BM daily   Genitourinary:  Negative for dysuria and hematuria.  Neurological:  Negative for tingling and headaches.  Psychiatric/Behavioral:  Negative for hallucinations and suicidal ideas.       Objective:     BP (!) 140/76   Pulse 73   Temp 98.3 F (36.8 C)  (Temporal)   Ht 5' 7.25" (1.708 m)   Wt 181 lb 9.6 oz (82.4 kg)   SpO2 98%   BMI 28.23 kg/m  BP Readings from Last 3 Encounters:  11/01/22 (!) 140/76  05/24/22 128/72  02/24/22 134/60   Wt Readings from Last 3 Encounters:  11/01/22 181 lb 9.6 oz (82.4 kg)  05/24/22 197 lb 6 oz (89.5 kg)  02/24/22 188 lb 8 oz (85.5 kg)   SpO2 Readings from Last 3 Encounters:  11/01/22 98%  05/24/22 98%  02/24/22 95%      Physical Exam Vitals and nursing note reviewed.  Constitutional:      Appearance: Normal appearance.  HENT:     Right Ear: Tympanic membrane, ear canal and external ear normal.     Left Ear: Tympanic membrane, ear canal and external ear normal.     Mouth/Throat:     Mouth: Mucous membranes are moist.     Pharynx: Oropharynx is clear.  Eyes:     Extraocular Movements: Extraocular movements intact.     Pupils: Pupils are equal, round, and reactive to light.  Cardiovascular:     Rate and Rhythm: Normal rate and regular rhythm.     Pulses: Normal pulses.     Heart sounds: Normal heart sounds.  Pulmonary:     Effort: Pulmonary effort is normal.     Breath sounds: Normal breath sounds.  Abdominal:     General: Bowel sounds are normal. There is no distension.     Palpations: There is no mass.     Tenderness: There is no abdominal tenderness.     Hernia: No hernia is present.  Musculoskeletal:     Right lower leg: No edema.     Left lower leg: No edema.  Lymphadenopathy:     Cervical: No cervical adenopathy.  Skin:    General: Skin is warm.  Neurological:     General: No focal deficit present.     Mental Status: He is alert.     Deep Tendon Reflexes:     Reflex Scores:      Bicep reflexes are 2+ on the right side and 2+ on the left side.      Patellar reflexes are 2+ on the right side and 2+ on the left side.    Comments: Bilateral upper and lower extremity strength 5/5  Psychiatric:        Mood and Affect: Mood normal.        Behavior: Behavior normal.         Thought Content: Thought content normal.        Judgment: Judgment normal.     Diabetic Foot Form - Detailed   Diabetic Foot Exam - detailed Is there swelling or and abnormal foot shape?: No Is there a claw toe deformity?: No Is there elevated skin temparature?: No Pulse Foot Exam completed.: Yes   Right posterior Tibialias: Present Left posterior Tibialias: Present   Right Dorsalis Pedis: Present Left Dorsalis Pedis: Present  Sensory Foot Exam Completed.: Yes Semmes-Weinstein Monofilament Test   Comments: All 10 sites sensation intact bilaterally      Results for orders placed or performed in visit on 11/01/22  POCT glycosylated hemoglobin (Hb A1C)  Result Value Ref Range   Hemoglobin A1C 6.2 (A) 4.0 - 5.6 %   HbA1c POC (<> result, manual entry)     HbA1c, POC (prediabetic range)     HbA1c, POC (controlled diabetic range)        The 10-year ASCVD risk score (Arnett DK, et al., 2019) is: 38.4%    Assessment & Plan:   Problem List Items Addressed This Visit       Cardiovascular and Mediastinum   Primary hypertension    Patient currently maintained on losartan and indapamide.  Blood pressure slightly above goal today but patient's blood pressure has been within goal all the other office visits.  Continue medication as prescribed      Relevant Orders   CBC   Comprehensive metabolic panel   TSH     Endocrine   Controlled type 2 diabetes mellitus without complication, without long-term current use of insulin (HCC)    Patient is A1c went from 6.7% to 6.2% well-controlled on diet modification and lifestyle modification only.  Continue will follow-up in 6 months      Relevant Orders   POCT glycosylated hemoglobin (Hb A1C) (Completed)   Lipid panel   Microalbumin / creatinine urine ratio     Genitourinary   Benign prostatic hyperplasia without lower urinary tract symptoms    History of the same pending PSA today        Other   Preventative health care -  Primary    Discussed age-appropriate immunizations and screening exams.  Did review patient's personal, surgical, social, family histories.  Patient is up-to-date on all age-appropriate vaccinations he would like.  Patient refused flu and shingles  vaccine today.  Ambulatory referral to GI for CRC screening today.  PSA for prostate cancer screening.  Patient was given information at discharge about preventative healthcare maintenance with anticipatory guidance      Relevant Orders   CBC   Comprehensive metabolic panel   TSH   Dyslipidemia, goal LDL below 130    Patient currently maintained on rosuvastatin 20 mg.  Pending lipid panel today.  Patient has lost weight and is working lifestyle modifications.      Relevant Orders   Lipid panel   Right shoulder pain    Patient no longer uses the gabapentin for his shoulder      Other Visit Diagnoses     Screening for prostate cancer       Relevant Orders   PSA, Medicare   Screening for colon cancer       Relevant Orders   Ambulatory referral to Gastroenterology       Return in about 6 months (around 05/01/2023) for DM recheck.    Audria Nine, NP

## 2022-11-01 NOTE — Assessment & Plan Note (Signed)
Patient currently maintained on rosuvastatin 20 mg.  Pending lipid panel today.  Patient has lost weight and is working lifestyle modifications.

## 2022-11-01 NOTE — Assessment & Plan Note (Signed)
History of the same pending PSA today

## 2022-11-01 NOTE — Patient Instructions (Addendum)
Nice to see you today I will be in touch with the labs once I have them  Follow up with me in 6 months for an A1C recheck A1C was 6.2% today which is great

## 2022-11-01 NOTE — Assessment & Plan Note (Signed)
Patient is A1c went from 6.7% to 6.2% well-controlled on diet modification and lifestyle modification only.  Continue will follow-up in 6 months

## 2022-11-01 NOTE — Assessment & Plan Note (Signed)
Patient no longer uses the gabapentin for his shoulder

## 2022-11-01 NOTE — Assessment & Plan Note (Signed)
Discussed age-appropriate immunizations and screening exams.  Did review patient's personal, surgical, social, family histories.  Patient is up-to-date on all age-appropriate vaccinations he would like.  Patient refused flu and shingles vaccine today.  Ambulatory referral to GI for CRC screening today.  PSA for prostate cancer screening.  Patient was given information at discharge about preventative healthcare maintenance with anticipatory guidance

## 2022-11-01 NOTE — Assessment & Plan Note (Signed)
Patient currently maintained on losartan and indapamide.  Blood pressure slightly above goal today but patient's blood pressure has been within goal all the other office visits.  Continue medication as prescribed

## 2022-11-03 ENCOUNTER — Telehealth: Payer: Self-pay | Admitting: Nurse Practitioner

## 2022-11-03 DIAGNOSIS — R972 Elevated prostate specific antigen [PSA]: Secondary | ICD-10-CM

## 2022-11-03 NOTE — Telephone Encounter (Signed)
Patient wife called in and stated that he would like to go ahead and get the PSA rechecked in a month. She also stated that he hasn't been taking his cholesterol medication for several months to see where his levels will be, but he has since started back taking them since his appointment. Scheduled patient for labs a month out.

## 2022-11-06 NOTE — Telephone Encounter (Signed)
Order placed for psa recheck

## 2022-11-06 NOTE — Addendum Note (Signed)
Addended by: Eden Emms on: 11/06/2022 01:32 PM   Modules accepted: Orders

## 2022-11-13 ENCOUNTER — Encounter: Payer: Self-pay | Admitting: Nurse Practitioner

## 2022-11-19 ENCOUNTER — Encounter: Payer: Self-pay | Admitting: Nurse Practitioner

## 2022-11-22 NOTE — Telephone Encounter (Signed)
Okay to schedule nurse visit for BP check per [name], when scheduling it needs to be on a day Eric Oliver is in office and [name] will do the nurse visit. LM for pt to return call.

## 2022-11-22 NOTE — Telephone Encounter (Signed)
Spoke with pt. He has been scheduled for 12/06/2022 at 0900 for this BP check. Pt stated that he has a lab appointment already that morning and it would be easier to do both appointments at the same time. Nothing further was needed.

## 2022-11-22 NOTE — Telephone Encounter (Signed)
Can we schedule the patient for a nurse visit for BP check. Remind him to bring his blood pressure cuff with him we are going to compare the readings

## 2022-12-06 ENCOUNTER — Encounter: Payer: Self-pay | Admitting: Nurse Practitioner

## 2022-12-06 ENCOUNTER — Ambulatory Visit: Payer: PPO

## 2022-12-06 ENCOUNTER — Other Ambulatory Visit: Payer: PPO

## 2022-12-06 DIAGNOSIS — I10 Essential (primary) hypertension: Secondary | ICD-10-CM | POA: Diagnosis not present

## 2022-12-06 DIAGNOSIS — R972 Elevated prostate specific antigen [PSA]: Secondary | ICD-10-CM | POA: Diagnosis not present

## 2022-12-06 LAB — PSA, MEDICARE: PSA: 4.06 ng/mL — ABNORMAL HIGH (ref 0.10–4.00)

## 2022-12-06 NOTE — Progress Notes (Signed)
Patient in office for nurse visit today. He was advised to make nurse visit appointment to check calibration of his home cuff.  Patient placed home cuff on arm like he normally does. There was some error with placement of cuff. I have reviewed proper placement and use of cuff. It was then applied by patient reading in office was 155/79 with HR of 65. On left arm sitting flat foot before checking. I then check in the same location directly after manually. Results from that reading were 154/80. Patient feels it was user error. Advised if any recommendations or instructions from Provider we will reach out. He will call if any questions.

## 2022-12-07 ENCOUNTER — Encounter: Payer: Self-pay | Admitting: Nurse Practitioner

## 2022-12-07 DIAGNOSIS — R972 Elevated prostate specific antigen [PSA]: Secondary | ICD-10-CM

## 2023-01-22 ENCOUNTER — Other Ambulatory Visit (INDEPENDENT_AMBULATORY_CARE_PROVIDER_SITE_OTHER): Payer: PPO

## 2023-01-22 DIAGNOSIS — R972 Elevated prostate specific antigen [PSA]: Secondary | ICD-10-CM

## 2023-01-22 LAB — PSA, MEDICARE: PSA: 4.42 ng/mL — ABNORMAL HIGH (ref 0.10–4.00)

## 2023-01-28 ENCOUNTER — Other Ambulatory Visit: Payer: Self-pay | Admitting: Nurse Practitioner

## 2023-01-28 DIAGNOSIS — E785 Hyperlipidemia, unspecified: Secondary | ICD-10-CM

## 2023-01-29 ENCOUNTER — Encounter: Payer: Self-pay | Admitting: Nurse Practitioner

## 2023-01-29 NOTE — Telephone Encounter (Signed)
Patient was given 6 months on 03/20/22. Called patient left message to call office need to verify that he is taking and needs refill. Last labs done 11/01/22

## 2023-01-29 NOTE — Telephone Encounter (Signed)
 Care team updated and letter sent for eye exam notes.

## 2023-03-03 ENCOUNTER — Other Ambulatory Visit: Payer: Self-pay | Admitting: Nurse Practitioner

## 2023-03-03 DIAGNOSIS — I10 Essential (primary) hypertension: Secondary | ICD-10-CM

## 2023-05-02 ENCOUNTER — Ambulatory Visit: Payer: PPO | Admitting: Nurse Practitioner
